# Patient Record
Sex: Male | Born: 1955
Health system: Southern US, Community
[De-identification: ages and names within clinical notes are randomized; demographics above are authoritative.]

## PROBLEM LIST (undated history)

## (undated) DIAGNOSIS — M1990 Unspecified osteoarthritis, unspecified site: Secondary | ICD-10-CM

## (undated) DIAGNOSIS — K602 Anal fissure, unspecified: Secondary | ICD-10-CM

## (undated) DIAGNOSIS — R42 Dizziness and giddiness: Secondary | ICD-10-CM

## (undated) DIAGNOSIS — I491 Atrial premature depolarization: Secondary | ICD-10-CM

## (undated) DIAGNOSIS — R002 Palpitations: Secondary | ICD-10-CM

## (undated) DIAGNOSIS — Z973 Presence of spectacles and contact lenses: Secondary | ICD-10-CM

## (undated) DIAGNOSIS — K219 Gastro-esophageal reflux disease without esophagitis: Secondary | ICD-10-CM

## (undated) DIAGNOSIS — I493 Ventricular premature depolarization: Secondary | ICD-10-CM

## (undated) DIAGNOSIS — R001 Bradycardia, unspecified: Secondary | ICD-10-CM

## (undated) DIAGNOSIS — R319 Hematuria, unspecified: Secondary | ICD-10-CM

## (undated) DIAGNOSIS — R9431 Abnormal electrocardiogram [ECG] [EKG]: Secondary | ICD-10-CM

## (undated) DIAGNOSIS — M109 Gout, unspecified: Secondary | ICD-10-CM

## (undated) DIAGNOSIS — F419 Anxiety disorder, unspecified: Secondary | ICD-10-CM

## (undated) DIAGNOSIS — Z87442 Personal history of urinary calculi: Secondary | ICD-10-CM

## (undated) HISTORY — DX: Anxiety disorder, unspecified: F41.9

## (undated) HISTORY — DX: Ventricular premature depolarization: I49.3

## (undated) HISTORY — DX: Anal fissure, unspecified: K60.2

## (undated) HISTORY — DX: Gout, unspecified: M10.9

## (undated) HISTORY — DX: Atrial premature depolarization: I49.1

## (undated) HISTORY — PX: SPHINCTEROTOMY: SHX5279

## (undated) HISTORY — DX: Bradycardia, unspecified: R00.1

## (undated) HISTORY — DX: Dizziness and giddiness: R42

## (undated) HISTORY — PX: COLONOSCOPY: SHX174

## (undated) HISTORY — PX: OTHER SURGICAL HISTORY: SHX169

## (undated) HISTORY — DX: Unspecified osteoarthritis, unspecified site: M19.90

## (undated) HISTORY — DX: Gastro-esophageal reflux disease without esophagitis: K21.9

---

## 2001-10-08 ENCOUNTER — Ambulatory Visit (HOSPITAL_COMMUNITY): Admission: RE | Admit: 2001-10-08 | Discharge: 2001-10-08 | Payer: Self-pay | Admitting: General Surgery

## 2003-06-12 ENCOUNTER — Encounter: Admission: RE | Admit: 2003-06-12 | Discharge: 2003-06-12 | Payer: Self-pay | Admitting: Internal Medicine

## 2005-02-21 ENCOUNTER — Ambulatory Visit: Payer: Self-pay | Admitting: Internal Medicine

## 2005-05-20 ENCOUNTER — Ambulatory Visit: Payer: Self-pay | Admitting: Internal Medicine

## 2008-09-01 ENCOUNTER — Encounter: Admission: RE | Admit: 2008-09-01 | Discharge: 2008-09-01 | Payer: Self-pay | Admitting: Otolaryngology

## 2008-12-12 ENCOUNTER — Encounter: Admission: RE | Admit: 2008-12-12 | Discharge: 2008-12-12 | Payer: Self-pay | Admitting: Otolaryngology

## 2010-01-31 ENCOUNTER — Encounter: Payer: Self-pay | Admitting: Internal Medicine

## 2010-02-13 ENCOUNTER — Emergency Department (HOSPITAL_COMMUNITY): Payer: BC Managed Care – PPO

## 2010-02-13 ENCOUNTER — Emergency Department (HOSPITAL_COMMUNITY)
Admission: EM | Admit: 2010-02-13 | Discharge: 2010-02-13 | Disposition: A | Discharge status: Home / Self Care | Payer: BC Managed Care – PPO | Attending: Emergency Medicine | Admitting: Emergency Medicine

## 2010-02-13 DIAGNOSIS — H81399 Other peripheral vertigo, unspecified ear: Secondary | ICD-10-CM | POA: Insufficient documentation

## 2010-02-13 LAB — POCT I-STAT, CHEM 8
Calcium, Ion: 1.14 mmol/L (ref 1.12–1.32)
HCT: 44 % (ref 39.0–52.0)
Potassium: 4.7 mEq/L (ref 3.5–5.1)
Sodium: 140 mEq/L (ref 135–145)

## 2010-05-28 NOTE — Op Note (Signed)
David Williamson, David Williamson                          ACCOUNT NO.:  [id number]   MEDICAL RECORD NO.:  [id number]                   PATIENT TYPE:  AMB   LOCATION:  DAY                                  FACILITY:  [facility]   PHYSICIAN:  Timothy E. Earlene Plater, M.D.              DATE OF BIRTH:  [birth year]   DATE OF PROCEDURE:  10/08/2001  DATE OF DISCHARGE:                                 OPERATIVE REPORT   PREOPERATIVE DIAGNOSIS:  Anal fissure.   POSTOPERATIVE DIAGNOSIS:  Anal fissure.   PROCEDURE:  Colonoscopy and repair of anal fissure.   SURGEON:  Timothy E. Earlene Plater, M.D.   COLONOSCOPIST:  Althea Grimmer. Luther Parody, M.D.   ANESTHESIA:  General.   INDICATIONS FOR PROCEDURE:  [name] has seen and followed in the office  for some months without resolution of anal fissure.  He is a very busy,  personality type A, driven, successful man, who understands his situation.  Because of his age, history of rectal bleeding, we have elected to seek GI  consultation, and colonoscopy will be done at the same occasion.  This is at  the patient's wish and actually works out very efficiently.  In the preop  work-up, his cardiogram is abnormal; however, three years ago in [place],  he had similar findings with a stress Cardiolite test that was negative.  We  will compare those results for him. He was identified and permit signed.   DESCRIPTION OF PROCEDURE:  He was taken to the operating room and placed  supine, LMA anesthesia provided.  He was then turned into the left lateral  position.  I carefully padded and positioned, and Dr. Luther Parody carried out  a colonoscopy and reported separately.  At the completion of his procedure,  the patient was left in the same position.  Perianal area prepped and draped  with Betadine.  Marcaine 0.25% with epinephrine was injected around and  about the anal orifice and massaged in well.  Anoscopy was negative.  A  prominent posterior anal fissure and sentinel tag were present.   The fissure  and tag were cauterized.  A left posterior percutaneous internal  sphincterotomy accomplished without difficulty or complication.  External  sphincter is intact.  He tolerated it well.  Gelfoam and a dry sterile  dressing applied, and he was removed to the recovery room in good condition.   Written and verbal instructions were given him and his wife, including  Percocet, and he will be seen and followed as an outpatient.                                               Timothy E. Earlene Plater, M.D.    TED/MEDQ  D:  10/08/2001  T:  10/08/2001  Job:  [id number]   cc:  Althea Grimmer. Luther Parody, M.D.  [address], [address]  [place]  [place] [address]  Fax: [phone]

## 2012-11-10 HISTORY — PX: OTHER SURGICAL HISTORY: SHX169

## 2013-12-30 ENCOUNTER — Other Ambulatory Visit (HOSPITAL_COMMUNITY): Payer: Self-pay | Admitting: Orthopaedic Surgery

## 2013-12-30 DIAGNOSIS — T84030A Mechanical loosening of internal right hip prosthetic joint, initial encounter: Secondary | ICD-10-CM

## 2014-01-13 ENCOUNTER — Encounter (HOSPITAL_COMMUNITY)
Admission: RE | Admit: 2014-01-13 | Discharge: 2014-01-13 | Disposition: A | Discharge status: Home / Self Care | Payer: BLUE CROSS/BLUE SHIELD | Source: Ambulatory Visit | Attending: Orthopaedic Surgery | Admitting: Orthopaedic Surgery

## 2014-01-13 DIAGNOSIS — T84030A Mechanical loosening of internal right hip prosthetic joint, initial encounter: Secondary | ICD-10-CM

## 2014-01-13 DIAGNOSIS — Z0389 Encounter for observation for other suspected diseases and conditions ruled out: Secondary | ICD-10-CM | POA: Diagnosis not present

## 2014-01-13 DIAGNOSIS — Z96641 Presence of right artificial hip joint: Secondary | ICD-10-CM | POA: Diagnosis not present

## 2014-01-13 MED ORDER — TECHNETIUM TC 99M MEDRONATE IV KIT
25.0000 | PACK | Freq: Once | INTRAVENOUS | Status: AC | PRN
  Administered 2014-01-13: 25 via INTRAVENOUS

## 2014-01-21 ENCOUNTER — Other Ambulatory Visit: Payer: Self-pay | Admitting: Orthopaedic Surgery

## 2014-01-21 DIAGNOSIS — M79671 Pain in right foot: Secondary | ICD-10-CM

## 2014-01-27 ENCOUNTER — Ambulatory Visit
Admission: RE | Admit: 2014-01-27 | Discharge: 2014-01-27 | Disposition: A | Discharge status: Home / Self Care | Payer: BLUE CROSS/BLUE SHIELD | Source: Ambulatory Visit | Attending: Orthopaedic Surgery | Admitting: Orthopaedic Surgery

## 2014-01-27 DIAGNOSIS — M79671 Pain in right foot: Secondary | ICD-10-CM

## 2014-02-07 DIAGNOSIS — Z96641 Presence of right artificial hip joint: Secondary | ICD-10-CM | POA: Insufficient documentation

## 2014-02-07 DIAGNOSIS — M25551 Pain in right hip: Secondary | ICD-10-CM | POA: Insufficient documentation

## 2014-09-29 ENCOUNTER — Ambulatory Visit (INDEPENDENT_AMBULATORY_CARE_PROVIDER_SITE_OTHER): Payer: BLUE CROSS/BLUE SHIELD | Admitting: Podiatry

## 2014-09-29 ENCOUNTER — Ambulatory Visit (INDEPENDENT_AMBULATORY_CARE_PROVIDER_SITE_OTHER): Payer: BLUE CROSS/BLUE SHIELD

## 2014-09-29 VITALS — BP 114/62 | HR 56 | Resp 16

## 2014-09-29 DIAGNOSIS — M1000 Idiopathic gout, unspecified site: Secondary | ICD-10-CM

## 2014-09-29 DIAGNOSIS — M779 Enthesopathy, unspecified: Secondary | ICD-10-CM | POA: Diagnosis not present

## 2014-09-29 DIAGNOSIS — M722 Plantar fascial fibromatosis: Secondary | ICD-10-CM | POA: Diagnosis not present

## 2014-09-29 NOTE — Progress Notes (Signed)
   Subjective:    Patient ID: David Williamson, male    DOB: 02-18-1955, 59 y.o.   MRN: [id number]  HPI Pt presents with bilateral foot pain in arches and dorsal forefoot, lasting 5 months, has tried various types of otc remedies with no relief, he is very active and feels as if pain is getting worse   Review of Systems  Musculoskeletal: Positive for arthralgias.  All other systems reviewed and are negative.      Objective:   Physical Exam        Assessment & Plan:

## 2014-10-01 ENCOUNTER — Telehealth (HOSPITAL_COMMUNITY): Payer: Self-pay | Admitting: *Deleted

## 2014-10-01 NOTE — Progress Notes (Signed)
Subjective:     Patient ID: David Williamson, male   DOB: 1955/11/28, 59 y.o.   MRN: [id number]  HPI patient presents stating he's had pain in both of his arches and diminishment of arch and heel pain of long-term duration. It has been sore but not severe and it always seems to bother him with activity   Review of Systems  All other systems reviewed and are negative.      Objective:   Physical Exam  Constitutional: He is oriented to person, place, and time.  Cardiovascular: Intact distal pulses.   Musculoskeletal: Normal range of motion.  Neurological: He is oriented to person, place, and time.  Skin: Skin is warm.  Nursing note and vitals reviewed.  neurovascular status intact muscle strength adequate range of motion within normal limits with patient found to have depression of the arch bilateral and also noted to have mild discomfort in the heel and arch of both feet with no specific areas of acute inflammation. Patient is well oriented 3 has good digital perfusion and mild equinus condition     Assessment:     Probable depression of the arch leading to fasciitis tendinitis-like condition secondary to foot structure    Plan:     H&P and x-rays reviewed. I have recommended a Berkley type orthotic to lift the arch and take stress off the heel and forefoot and patient is scanned for [facility] type orthotic devices. Reappoint when returned

## 2014-10-02 ENCOUNTER — Other Ambulatory Visit (HOSPITAL_COMMUNITY): Payer: Self-pay | Admitting: Internal Medicine

## 2014-10-02 DIAGNOSIS — R001 Bradycardia, unspecified: Secondary | ICD-10-CM

## 2014-10-03 DIAGNOSIS — R002 Palpitations: Secondary | ICD-10-CM | POA: Insufficient documentation

## 2014-10-03 DIAGNOSIS — R001 Bradycardia, unspecified: Secondary | ICD-10-CM | POA: Insufficient documentation

## 2014-10-06 ENCOUNTER — Other Ambulatory Visit: Payer: Self-pay

## 2014-10-06 ENCOUNTER — Ambulatory Visit (INDEPENDENT_AMBULATORY_CARE_PROVIDER_SITE_OTHER): Payer: BLUE CROSS/BLUE SHIELD

## 2014-10-06 ENCOUNTER — Ambulatory Visit (HOSPITAL_COMMUNITY): Payer: BLUE CROSS/BLUE SHIELD | Attending: Cardiology

## 2014-10-06 DIAGNOSIS — I351 Nonrheumatic aortic (valve) insufficiency: Secondary | ICD-10-CM | POA: Diagnosis not present

## 2014-10-06 DIAGNOSIS — R001 Bradycardia, unspecified: Secondary | ICD-10-CM | POA: Diagnosis not present

## 2014-10-06 DIAGNOSIS — R002 Palpitations: Secondary | ICD-10-CM | POA: Diagnosis not present

## 2014-10-06 DIAGNOSIS — I517 Cardiomegaly: Secondary | ICD-10-CM | POA: Insufficient documentation

## 2014-10-31 ENCOUNTER — Ambulatory Visit: Payer: BLUE CROSS/BLUE SHIELD | Admitting: *Deleted

## 2014-10-31 DIAGNOSIS — M722 Plantar fascial fibromatosis: Secondary | ICD-10-CM

## 2014-10-31 NOTE — Patient Instructions (Signed)

## 2014-10-31 NOTE — Progress Notes (Signed)
Patient ID: David Williamson, male   DOB: 1955/12/14, 59 y.o.   MRN: [id number] Patient presents for orthotic pick up.  Verbal and written break in and wear instructions given.  Patient will follow up in 4 weeks if symptoms worsen or fail to improve.

## 2014-11-05 ENCOUNTER — Ambulatory Visit: Payer: BLUE CROSS/BLUE SHIELD | Admitting: Internal Medicine

## 2014-11-26 ENCOUNTER — Encounter: Payer: Self-pay | Admitting: Internal Medicine

## 2014-12-01 ENCOUNTER — Encounter: Payer: Self-pay | Admitting: Internal Medicine

## 2014-12-01 ENCOUNTER — Ambulatory Visit (INDEPENDENT_AMBULATORY_CARE_PROVIDER_SITE_OTHER): Payer: BLUE CROSS/BLUE SHIELD | Admitting: Internal Medicine

## 2014-12-01 VITALS — BP 118/76 | HR 50 | Ht 74.0 in | Wt 191.0 lb

## 2014-12-01 DIAGNOSIS — R001 Bradycardia, unspecified: Secondary | ICD-10-CM

## 2014-12-01 DIAGNOSIS — I491 Atrial premature depolarization: Secondary | ICD-10-CM | POA: Diagnosis not present

## 2014-12-01 DIAGNOSIS — R002 Palpitations: Secondary | ICD-10-CM | POA: Diagnosis not present

## 2014-12-01 NOTE — Patient Instructions (Signed)
Medication Instructions:  Your physician recommends that you continue on your current medications as directed. Please refer to the Current Medication list given to you today.  Labwork: None ordered.  Testing/Procedures: None ordered.  Follow-Up: Your physician recommends that you schedule a follow-up appointment as needed.   Any Other Special Instructions Will Be Listed Below (If Applicable).     If you need a refill on your cardiac medications before your next appointment, please call your pharmacy.   

## 2014-12-01 NOTE — Progress Notes (Signed)
Electrophysiology Office Note   Date:  12/01/2014   ID:  David Williamson, DOB July 14, 1955, MRN [id number]  PCP:  Gwen Pounds, MD   Primary Electrophysiologist: Hillis Range, MD    Chief Complaint  Patient presents with  . Palpitations     History of Present Illness: [name] is a 59 y.o. male who presents today for electrophysiology evaluation.   The patient has a long history of bradycardia for which he has been asymptomatic.  He is physically very fit and exercises rigorously without limitation.  He has more recently noticed palpitations.  He describes "fluttering" and "skipped" heart beats, lasting only seconds.  These are infrequent but have caused him concern.  He underwent recent echo testing which was normal except for LVH.  He also had a holter monitor placed which documented sinus rhythm with frequent PACs and very rare PVCS.  He did have nonsustained atach also.  Today, he denies symptoms of chest pain, shortness of breath, orthopnea, PND, lower extremity edema, claudication, dizziness, presyncope, syncope, bleeding, or neurologic sequela. The patient is tolerating medications without difficulties and is otherwise without complaint today.    Past Medical History  Diagnosis Date  . Gout   . Anal fissure   . Vertigo     one episode  . Sinus bradycardia     asymptomatic  . Premature atrial contractions   . Premature ventricular contraction    Past Surgical History  Procedure Laterality Date  . Sphincterotomy    . Right hip replacement  11/14     Current Outpatient Prescriptions  Medication Sig Dispense Refill  . allopurinol (ZYLOPRIM) 300 MG tablet Take 300 mg by mouth daily.  0  . meloxicam (MOBIC) 7.5 MG tablet Take 7.5 mg by mouth daily as needed. (PAIN)     No current facility-administered medications for this visit.    Allergies:   Review of patient's allergies indicates no known allergies.   Social History:  The patient  reports that he has never  smoked. He has never used smokeless tobacco. He reports that he drinks about 8.4 oz of alcohol per week. He reports that he does not use illicit drugs.   Family History:  The patient's  family history includes Cancer in his father; Coronary artery disease in his mother; Diabetes Mellitus II in his mother; Hypertension in an other family member; Kidney cancer in his brother.   Denies FH of sudden death or arrhythmias   ROS:  Please see the history of present illness.   All other systems are reviewed and negative.    PHYSICAL EXAM: VS:  BP 118/76 mmHg  Pulse 50  Ht  (1.88 m)  Wt 191 lb (86.637 kg)  BMI 24.51 kg/m2 , BMI Body mass index is 24.51 kg/(m^2). GEN: Well nourished, well developed, in no acute distress HEENT: normal Neck: no JVD, carotid bruits, or masses Cardiac: RRR; no murmurs, rubs, or gallops,no edema  Respiratory:  clear to auscultation bilaterally, normal work of breathing GI: soft, nontender, nondistended, + BS MS: no deformity or atrophy Skin: warm and dry  Neuro:  Strength and sensation are intact Psych: euthymic mood, full affect  EKG:  EKG is ordered today. The ekg ordered today shows sinus rhythm 50 bpm with PACs, early repolarization   Recent Labs: No results found for requested labs within last 365 days.    Lipid Panel  No results found for: CHOL, TRIG, HDL, CHOLHDL, VLDL, LDLCALC, LDLDIRECT   Wt Readings from Last 3  Encounters:  12/01/14 191 lb (86.637 kg)      Other studies Reviewed: Additional studies/ records that were reviewed today include: Dr Ferd Hibbsusso's notes, prior echo, holter monitor   ASSESSMENT AND PLAN:  1.  Palpitations Per holter are due to pacs and pvcs He has no evidence of structural heart disease.  I have reassured the patient that his symptoms appear to be from a benign source. No further workup is planned Stress avoidance and ETOH avoidance are encouraged.  Caffeine in moderation should be ok..  2. Sinus  bradycardia Asymptomatic No further workup required No indication for pacing at this time  Follow-up with Dr Timothy Lassousso as scheduled I will see as needed going forward  Current medicines are reviewed at length with the patient today.   The patient does not have concerns regarding his medicines.  The following changes were made today:  none   Signed Hillis RangeJames Eshika Reckart, MD  12/01/2014 9:31 PM     [facility] [address] [place] [address] [phone] (office) [phone] (fax)

## 2014-12-08 ENCOUNTER — Encounter: Payer: Self-pay | Admitting: Podiatry

## 2014-12-08 ENCOUNTER — Ambulatory Visit (INDEPENDENT_AMBULATORY_CARE_PROVIDER_SITE_OTHER): Payer: BLUE CROSS/BLUE SHIELD | Admitting: Podiatry

## 2014-12-08 VITALS — BP 125/74 | HR 50 | Resp 16

## 2014-12-08 DIAGNOSIS — M1000 Idiopathic gout, unspecified site: Secondary | ICD-10-CM

## 2014-12-08 DIAGNOSIS — M722 Plantar fascial fibromatosis: Secondary | ICD-10-CM

## 2014-12-08 MED ORDER — TRIAMCINOLONE ACETONIDE 10 MG/ML IJ SUSP
10.0000 mg | Freq: Once | INTRAMUSCULAR | Status: AC
  Administered 2014-12-08: 10 mg

## 2014-12-08 NOTE — Progress Notes (Signed)
Subjective:     Patient ID: David Williamson, male   DOB: 04/02/1955, 59 y.o.   MRN: [id number]  HPI patient states I'm having trouble with my left arch with the right one feeling good. It hurts in the arch past where it inserts in the heel and it seems worse with activity   Review of Systems     Objective:   Physical Exam Neurovascular status intact muscle strength adequate with discomfort in the left plantar arch distal to the insertion to the calcaneus with inflammation noted. Patient also seems to have moderate depression of the arch    Assessment:     Plantar fascial symptomatology present left    Plan:     Advised on physical therapy and at this time injected the distal 2 calcaneal plantar fascia 3 mg Kenalog 5 mill grams Xylocaine and dispensed night splint

## 2014-12-30 DIAGNOSIS — M545 Low back pain, unspecified: Secondary | ICD-10-CM | POA: Insufficient documentation

## 2015-02-02 ENCOUNTER — Encounter: Payer: Self-pay | Admitting: Podiatry

## 2015-02-02 ENCOUNTER — Ambulatory Visit (INDEPENDENT_AMBULATORY_CARE_PROVIDER_SITE_OTHER): Payer: BLUE CROSS/BLUE SHIELD | Admitting: Podiatry

## 2015-02-02 VITALS — BP 125/69 | HR 47 | Resp 16

## 2015-02-02 DIAGNOSIS — M779 Enthesopathy, unspecified: Secondary | ICD-10-CM | POA: Diagnosis not present

## 2015-02-02 DIAGNOSIS — M722 Plantar fascial fibromatosis: Secondary | ICD-10-CM | POA: Diagnosis not present

## 2015-02-02 NOTE — Progress Notes (Signed)
Subjective:     Patient ID: David Williamson, male   DOB: 01-04-1956, 60 y.o.   MRN: [id number]  HPI patient presents stating I still get discomfort in my left arch if I'm real active   Review of Systems     Objective:   Physical Exam Neurovascular status intact muscle strength adequate with discomfort in the mid arch area left that his low grade but present    Assessment:     Continued low grade fasciitis left    Plan:     We discussed modifications and exercise and also continued physical therapy and boot usage. Patient's left orthotic a be redone and we will see how he does with that

## 2015-02-27 ENCOUNTER — Ambulatory Visit: Payer: BLUE CROSS/BLUE SHIELD | Admitting: *Deleted

## 2015-02-27 DIAGNOSIS — M722 Plantar fascial fibromatosis: Secondary | ICD-10-CM

## 2015-02-27 NOTE — Progress Notes (Signed)
Patient ID: David Williamson, male   DOB: 10/04/1955, 59 y.o.   MRN: [id number] Patient presents for orthotic pick up.  Verbal and written break in and wear instructions given.  Patient will follow up in 4 weeks if symptoms worsen or fail to improve. 

## 2015-02-27 NOTE — Patient Instructions (Signed)

## 2015-04-13 ENCOUNTER — Ambulatory Visit: Payer: BLUE CROSS/BLUE SHIELD | Admitting: *Deleted

## 2015-04-13 DIAGNOSIS — M722 Plantar fascial fibromatosis: Secondary | ICD-10-CM

## 2015-04-13 NOTE — Progress Notes (Signed)
Patient ID: David Williamson, male   DOB: 1955/09/26, 60 y.o.   MRN: [id number] Patient presents stating that orthotics are still causing pain especially the left so much so that he can no longer wear it.  After visual inspection it appears arch is not contouring well and is low will send back of adjustment and call when they return.

## 2015-05-14 ENCOUNTER — Other Ambulatory Visit: Payer: Self-pay | Admitting: Gastroenterology

## 2015-05-14 DIAGNOSIS — R109 Unspecified abdominal pain: Secondary | ICD-10-CM | POA: Diagnosis not present

## 2015-05-14 DIAGNOSIS — Z8051 Family history of malignant neoplasm of kidney: Secondary | ICD-10-CM | POA: Diagnosis not present

## 2015-05-14 DIAGNOSIS — R1013 Epigastric pain: Secondary | ICD-10-CM | POA: Diagnosis not present

## 2015-05-25 ENCOUNTER — Other Ambulatory Visit: Payer: Self-pay

## 2015-05-26 ENCOUNTER — Encounter: Payer: Self-pay | Admitting: Internal Medicine

## 2015-06-02 ENCOUNTER — Ambulatory Visit
Admission: RE | Admit: 2015-06-02 | Discharge: 2015-06-02 | Disposition: A | Discharge status: Home / Self Care | Payer: BLUE CROSS/BLUE SHIELD | Source: Ambulatory Visit | Attending: Gastroenterology | Admitting: Gastroenterology

## 2015-06-02 DIAGNOSIS — R109 Unspecified abdominal pain: Secondary | ICD-10-CM

## 2015-06-09 ENCOUNTER — Encounter: Payer: Self-pay | Admitting: Internal Medicine

## 2015-06-10 ENCOUNTER — Encounter: Payer: Self-pay | Admitting: Internal Medicine

## 2015-06-22 ENCOUNTER — Ambulatory Visit (AMBULATORY_SURGERY_CENTER): Payer: Self-pay | Admitting: *Deleted

## 2015-06-22 VITALS — Ht 73.0 in | Wt 190.0 lb

## 2015-06-22 DIAGNOSIS — M109 Gout, unspecified: Secondary | ICD-10-CM | POA: Diagnosis not present

## 2015-06-22 DIAGNOSIS — Z125 Encounter for screening for malignant neoplasm of prostate: Secondary | ICD-10-CM | POA: Diagnosis not present

## 2015-06-22 DIAGNOSIS — Z0000 Encounter for general adult medical examination without abnormal findings: Secondary | ICD-10-CM | POA: Diagnosis not present

## 2015-06-22 DIAGNOSIS — Z1211 Encounter for screening for malignant neoplasm of colon: Secondary | ICD-10-CM

## 2015-06-22 MED ORDER — NA SULFATE-K SULFATE-MG SULF 17.5-3.13-1.6 GM/177ML PO SOLN: 1.0000 | Freq: Once | ORAL | Status: DC

## 2015-06-22 NOTE — Progress Notes (Signed)
No egg or soy allergy known to patient  No issues with past sedation with any surgeries  or procedures, no intubation problems  No diet pills per patient No home 02 use per patient  No blood thinners per patient  Pt denies issues with constipation  emmi declined   

## 2015-06-24 ENCOUNTER — Encounter: Payer: Self-pay | Admitting: Internal Medicine

## 2015-06-29 DIAGNOSIS — M859 Disorder of bone density and structure, unspecified: Secondary | ICD-10-CM | POA: Diagnosis not present

## 2015-06-29 DIAGNOSIS — R001 Bradycardia, unspecified: Secondary | ICD-10-CM | POA: Diagnosis not present

## 2015-06-29 DIAGNOSIS — R002 Palpitations: Secondary | ICD-10-CM | POA: Diagnosis not present

## 2015-06-29 DIAGNOSIS — Z6825 Body mass index (BMI) 25.0-25.9, adult: Secondary | ICD-10-CM | POA: Diagnosis not present

## 2015-06-29 DIAGNOSIS — R109 Unspecified abdominal pain: Secondary | ICD-10-CM | POA: Diagnosis not present

## 2015-06-29 DIAGNOSIS — Z0000 Encounter for general adult medical examination without abnormal findings: Secondary | ICD-10-CM | POA: Diagnosis not present

## 2015-06-29 DIAGNOSIS — Z1389 Encounter for screening for other disorder: Secondary | ICD-10-CM | POA: Diagnosis not present

## 2015-06-29 DIAGNOSIS — M1990 Unspecified osteoarthritis, unspecified site: Secondary | ICD-10-CM | POA: Diagnosis not present

## 2015-07-03 DIAGNOSIS — Z1212 Encounter for screening for malignant neoplasm of rectum: Secondary | ICD-10-CM | POA: Diagnosis not present

## 2015-07-07 ENCOUNTER — Encounter: Payer: Self-pay | Admitting: Internal Medicine

## 2015-07-07 ENCOUNTER — Ambulatory Visit (AMBULATORY_SURGERY_CENTER): Payer: BLUE CROSS/BLUE SHIELD | Admitting: Internal Medicine

## 2015-07-07 VITALS — BP 111/73 | HR 47 | Temp 97.8°F | Resp 8 | Ht 73.0 in | Wt 190.0 lb

## 2015-07-07 DIAGNOSIS — Z1211 Encounter for screening for malignant neoplasm of colon: Secondary | ICD-10-CM

## 2015-07-07 MED ORDER — SODIUM CHLORIDE 0.9 % IV SOLN: 500.0000 mL | INTRAVENOUS | Status: DC

## 2015-07-07 NOTE — Progress Notes (Signed)
To recovery, report to McCoy, RN, VSS 

## 2015-07-07 NOTE — Patient Instructions (Signed)
Discharge instructions given. Handouts on diverticulosis and a high fiber diet Resume previous medications. YOU HAD AN ENDOSCOPIC PROCEDURE TODAY AT THE [facility]:   Refer to the procedure report that was given to you for any specific questions about what was found during the examination.  If the procedure report does not answer your questions, please call your gastroenterologist to clarify.  If you requested that your care partner not be given the details of your procedure findings, then the procedure report has been included in a sealed envelope for you to review at your convenience later.  YOU SHOULD EXPECT: Some feelings of bloating in the abdomen. Passage of more gas than usual.  Walking can help get rid of the air that was put into your GI tract during the procedure and reduce the bloating. If you had a lower endoscopy (such as a colonoscopy or flexible sigmoidoscopy) you may notice spotting of blood in your stool or on the toilet paper. If you underwent a bowel prep for your procedure, you may not have a normal bowel movement for a few days.  Please Note:  You might notice some irritation and congestion in your nose or some drainage.  This is from the oxygen used during your procedure.  There is no need for concern and it should clear up in a day or so.  SYMPTOMS TO REPORT IMMEDIATELY:   Following lower endoscopy (colonoscopy or flexible sigmoidoscopy):  Excessive amounts of blood in the stool  Significant tenderness or worsening of abdominal pains  Swelling of the abdomen that is new, acute  Fever of 100F or higher   For urgent or emergent issues, a gastroenterologist can be reached at any hour by calling [phone].   DIET: Your first meal following the procedure should be a small meal and then it is ok to progress to your normal diet. Heavy or fried foods are harder to digest and may make you feel nauseous or bloated.  Likewise, meals heavy in dairy and vegetables  can increase bloating.  Drink plenty of fluids but you should avoid alcoholic beverages for 24 hours.  ACTIVITY:  You should plan to take it easy for the rest of today and you should NOT DRIVE or use heavy machinery until tomorrow (because of the sedation medicines used during the test).    FOLLOW UP: Our staff will call the number listed on your records the next business day following your procedure to check on you and address any questions or concerns that you may have regarding the information given to you following your procedure. If we do not reach you, we will leave a message.  However, if you are feeling well and you are not experiencing any problems, there is no need to return our call.  We will assume that you have returned to your regular daily activities without incident.  If any biopsies were taken you will be contacted by phone or by letter within the next 1-3 weeks.  Please call us at [phone] if you have not heard about the biopsies in 3 weeks.    SIGNATURES/CONFIDENTIALITY: You and/or your care partner have signed paperwork which will be entered into your electronic medical record.  These signatures attest to the fact that that the information above on your After Visit Summary has been reviewed and is understood.  Full responsibility of the confidentiality of this discharge information lies with you and/or your care-partner. 

## 2015-07-07 NOTE — Op Note (Signed)
[facility] Patient Name: Roxy Cedarnthony Telfair Procedure Date: 07/07/2015 1:07 PM MRN: [id number] Endoscopist: Wilhemina BonitoJohn N. Marina GoodellPerry , MD Age: [age] Referring MD:  Date of Birth: 1955-10-02 Gender: Male Account #: [id number] Procedure:                Colonoscopy Indications:              Screening for colorectal malignant neoplasm. Prior                            examinations 2003 and 2007 negative for neoplasia. Medicines:                Monitored Anesthesia Care Procedure:                Pre-Anesthesia Assessment:                           - Prior to the procedure, a History and Physical                            was performed, and patient medications and                            allergies were reviewed. The patient's tolerance of                            previous anesthesia was also reviewed. The risks                            and benefits of the procedure and the sedation                            options and risks were discussed with the patient.                            All questions were answered, and informed consent                            was obtained. Prior Anticoagulants: The patient has                            taken no previous anticoagulant or antiplatelet                            agents. ASA Grade Assessment: I - A normal, healthy                            patient. After reviewing the risks and benefits,                            the patient was deemed in satisfactory condition to                            undergo the procedure.  After obtaining informed consent, the colonoscope                            was passed under direct vision. Throughout the                            procedure, the patient's blood pressure, pulse, and                            oxygen saturations were monitored continuously. The                            Model CF-HQ190L [id number](SN#[id number]) scope was introduced                            through the anus and  advanced to the the cecum,                            identified by appendiceal orifice and ileocecal                            valve. The ileocecal valve, appendiceal orifice,                            and rectum were photographed. The quality of the                            bowel preparation was excellent. The colonoscopy                            was performed without difficulty. The patient                            tolerated the procedure well. The bowel preparation                            used was SUPREP. Scope In: 1:23:04 PM Scope Out: 1:38:27 PM Scope Withdrawal Time: 0 hours 10 minutes 39 seconds  Total Procedure Duration: 0 hours 15 minutes 23 seconds  Findings:                 Multiple diverticula were found in the left colon                            and right colon.                           The exam was otherwise without abnormality on                            direct and retroflexion views. Complications:            No immediate complications. Estimated blood loss:  None. Estimated Blood Loss:     Estimated blood loss: none. Impression:               - Diverticulosis in the left colon and in the right                            colon.                           - The examination was otherwise normal on direct                            and retroflexion views.                           - No specimens collected. Recommendation:           - Repeat colonoscopy in 10 years for screening                            purposes.                           - Patient has a contact number available for                            emergencies. The signs and symptoms of potential                            delayed complications were discussed with the                            patient. Return to normal activities tomorrow.                            Written discharge instructions were provided to the                            patient.                            - Resume previous diet.                           - Continue present medications. Wilhemina Bonito. Marina Goodell, MD 07/07/2015 1:44:48 PM This report has been signed electronically. CC Letter to:             Creola Corn, MD

## 2015-07-08 ENCOUNTER — Telehealth: Payer: Self-pay | Admitting: *Deleted

## 2015-07-08 NOTE — Telephone Encounter (Signed)
  Follow up Call-  Call back number 07/07/2015  Post procedure Call Back phone  # [phone]  Permission to leave phone message Yes     Patient questions:  Do you have a fever, pain , or abdominal swelling? No. Pain Score  0 *  Have you tolerated food without any problems? Yes.    Have you been able to return to your normal activities? Yes.    Do you have any questions about your discharge instructions: Diet   No. Medications  No. Follow up visit  No.  Do you have questions or concerns about your Care? No.  Actions: * If pain score is 4 or above: No action needed, pain <4.

## 2015-08-10 ENCOUNTER — Encounter: Payer: BLUE CROSS/BLUE SHIELD | Admitting: Internal Medicine

## 2015-08-25 ENCOUNTER — Encounter: Payer: BLUE CROSS/BLUE SHIELD | Admitting: Internal Medicine

## 2015-10-14 DIAGNOSIS — Z23 Encounter for immunization: Secondary | ICD-10-CM | POA: Diagnosis not present

## 2015-11-02 DIAGNOSIS — D2261 Melanocytic nevi of right upper limb, including shoulder: Secondary | ICD-10-CM | POA: Diagnosis not present

## 2015-11-02 DIAGNOSIS — D1801 Hemangioma of skin and subcutaneous tissue: Secondary | ICD-10-CM | POA: Diagnosis not present

## 2015-11-02 DIAGNOSIS — D225 Melanocytic nevi of trunk: Secondary | ICD-10-CM | POA: Diagnosis not present

## 2015-11-02 DIAGNOSIS — D485 Neoplasm of uncertain behavior of skin: Secondary | ICD-10-CM | POA: Diagnosis not present

## 2015-11-02 DIAGNOSIS — L821 Other seborrheic keratosis: Secondary | ICD-10-CM | POA: Diagnosis not present

## 2016-05-23 DIAGNOSIS — M5126 Other intervertebral disc displacement, lumbar region: Secondary | ICD-10-CM | POA: Diagnosis not present

## 2016-05-23 DIAGNOSIS — M5136 Other intervertebral disc degeneration, lumbar region: Secondary | ICD-10-CM | POA: Diagnosis not present

## 2016-05-23 DIAGNOSIS — M545 Low back pain: Secondary | ICD-10-CM | POA: Diagnosis not present

## 2016-05-23 DIAGNOSIS — M791 Myalgia: Secondary | ICD-10-CM | POA: Diagnosis not present

## 2016-05-23 DIAGNOSIS — R200 Anesthesia of skin: Secondary | ICD-10-CM | POA: Diagnosis not present

## 2016-05-27 DIAGNOSIS — M5136 Other intervertebral disc degeneration, lumbar region: Secondary | ICD-10-CM | POA: Diagnosis not present

## 2016-05-27 DIAGNOSIS — R200 Anesthesia of skin: Secondary | ICD-10-CM | POA: Diagnosis not present

## 2016-05-27 DIAGNOSIS — M791 Myalgia: Secondary | ICD-10-CM | POA: Diagnosis not present

## 2016-05-27 DIAGNOSIS — M545 Low back pain: Secondary | ICD-10-CM | POA: Diagnosis not present

## 2016-05-27 DIAGNOSIS — M5126 Other intervertebral disc displacement, lumbar region: Secondary | ICD-10-CM | POA: Diagnosis not present

## 2016-05-30 DIAGNOSIS — R200 Anesthesia of skin: Secondary | ICD-10-CM | POA: Diagnosis not present

## 2016-05-30 DIAGNOSIS — M5126 Other intervertebral disc displacement, lumbar region: Secondary | ICD-10-CM | POA: Diagnosis not present

## 2016-05-30 DIAGNOSIS — M5136 Other intervertebral disc degeneration, lumbar region: Secondary | ICD-10-CM | POA: Diagnosis not present

## 2016-05-30 DIAGNOSIS — M545 Low back pain: Secondary | ICD-10-CM | POA: Diagnosis not present

## 2016-05-30 DIAGNOSIS — M791 Myalgia: Secondary | ICD-10-CM | POA: Diagnosis not present

## 2016-06-03 DIAGNOSIS — M5136 Other intervertebral disc degeneration, lumbar region: Secondary | ICD-10-CM | POA: Diagnosis not present

## 2016-06-03 DIAGNOSIS — M545 Low back pain: Secondary | ICD-10-CM | POA: Diagnosis not present

## 2016-06-03 DIAGNOSIS — M5126 Other intervertebral disc displacement, lumbar region: Secondary | ICD-10-CM | POA: Diagnosis not present

## 2016-06-03 DIAGNOSIS — M791 Myalgia: Secondary | ICD-10-CM | POA: Diagnosis not present

## 2016-06-03 DIAGNOSIS — R200 Anesthesia of skin: Secondary | ICD-10-CM | POA: Diagnosis not present

## 2016-06-10 DIAGNOSIS — M5126 Other intervertebral disc displacement, lumbar region: Secondary | ICD-10-CM | POA: Diagnosis not present

## 2016-06-10 DIAGNOSIS — M791 Myalgia: Secondary | ICD-10-CM | POA: Diagnosis not present

## 2016-06-10 DIAGNOSIS — M545 Low back pain: Secondary | ICD-10-CM | POA: Diagnosis not present

## 2016-06-10 DIAGNOSIS — R200 Anesthesia of skin: Secondary | ICD-10-CM | POA: Diagnosis not present

## 2016-06-10 DIAGNOSIS — M5136 Other intervertebral disc degeneration, lumbar region: Secondary | ICD-10-CM | POA: Diagnosis not present

## 2016-06-13 DIAGNOSIS — M5126 Other intervertebral disc displacement, lumbar region: Secondary | ICD-10-CM | POA: Diagnosis not present

## 2016-06-13 DIAGNOSIS — M791 Myalgia: Secondary | ICD-10-CM | POA: Diagnosis not present

## 2016-06-13 DIAGNOSIS — M545 Low back pain: Secondary | ICD-10-CM | POA: Diagnosis not present

## 2016-06-13 DIAGNOSIS — R200 Anesthesia of skin: Secondary | ICD-10-CM | POA: Diagnosis not present

## 2016-06-13 DIAGNOSIS — M5136 Other intervertebral disc degeneration, lumbar region: Secondary | ICD-10-CM | POA: Diagnosis not present

## 2016-06-27 DIAGNOSIS — Z125 Encounter for screening for malignant neoplasm of prostate: Secondary | ICD-10-CM | POA: Diagnosis not present

## 2016-06-27 DIAGNOSIS — M859 Disorder of bone density and structure, unspecified: Secondary | ICD-10-CM | POA: Diagnosis not present

## 2016-06-27 DIAGNOSIS — Z0000 Encounter for general adult medical examination without abnormal findings: Secondary | ICD-10-CM | POA: Diagnosis not present

## 2016-06-27 DIAGNOSIS — M109 Gout, unspecified: Secondary | ICD-10-CM | POA: Diagnosis not present

## 2016-06-28 DIAGNOSIS — M5136 Other intervertebral disc degeneration, lumbar region: Secondary | ICD-10-CM | POA: Diagnosis not present

## 2016-06-28 DIAGNOSIS — M5126 Other intervertebral disc displacement, lumbar region: Secondary | ICD-10-CM | POA: Diagnosis not present

## 2016-06-28 DIAGNOSIS — M545 Low back pain: Secondary | ICD-10-CM | POA: Diagnosis not present

## 2016-06-28 DIAGNOSIS — R200 Anesthesia of skin: Secondary | ICD-10-CM | POA: Diagnosis not present

## 2016-06-28 DIAGNOSIS — M791 Myalgia: Secondary | ICD-10-CM | POA: Diagnosis not present

## 2016-07-01 DIAGNOSIS — M791 Myalgia: Secondary | ICD-10-CM | POA: Diagnosis not present

## 2016-07-01 DIAGNOSIS — M545 Low back pain: Secondary | ICD-10-CM | POA: Diagnosis not present

## 2016-07-01 DIAGNOSIS — R200 Anesthesia of skin: Secondary | ICD-10-CM | POA: Diagnosis not present

## 2016-07-01 DIAGNOSIS — M5136 Other intervertebral disc degeneration, lumbar region: Secondary | ICD-10-CM | POA: Diagnosis not present

## 2016-07-01 DIAGNOSIS — M5126 Other intervertebral disc displacement, lumbar region: Secondary | ICD-10-CM | POA: Diagnosis not present

## 2016-07-08 DIAGNOSIS — M5126 Other intervertebral disc displacement, lumbar region: Secondary | ICD-10-CM | POA: Diagnosis not present

## 2016-07-08 DIAGNOSIS — M5136 Other intervertebral disc degeneration, lumbar region: Secondary | ICD-10-CM | POA: Diagnosis not present

## 2016-07-08 DIAGNOSIS — R001 Bradycardia, unspecified: Secondary | ICD-10-CM | POA: Diagnosis not present

## 2016-07-08 DIAGNOSIS — M1990 Unspecified osteoarthritis, unspecified site: Secondary | ICD-10-CM | POA: Diagnosis not present

## 2016-07-08 DIAGNOSIS — M791 Myalgia: Secondary | ICD-10-CM | POA: Diagnosis not present

## 2016-07-08 DIAGNOSIS — Z0000 Encounter for general adult medical examination without abnormal findings: Secondary | ICD-10-CM | POA: Diagnosis not present

## 2016-07-08 DIAGNOSIS — M859 Disorder of bone density and structure, unspecified: Secondary | ICD-10-CM | POA: Diagnosis not present

## 2016-07-08 DIAGNOSIS — M545 Low back pain: Secondary | ICD-10-CM | POA: Diagnosis not present

## 2016-07-08 DIAGNOSIS — R200 Anesthesia of skin: Secondary | ICD-10-CM | POA: Diagnosis not present

## 2016-07-08 DIAGNOSIS — M109 Gout, unspecified: Secondary | ICD-10-CM | POA: Diagnosis not present

## 2016-07-11 DIAGNOSIS — M791 Myalgia: Secondary | ICD-10-CM | POA: Diagnosis not present

## 2016-07-11 DIAGNOSIS — R200 Anesthesia of skin: Secondary | ICD-10-CM | POA: Diagnosis not present

## 2016-07-11 DIAGNOSIS — M5136 Other intervertebral disc degeneration, lumbar region: Secondary | ICD-10-CM | POA: Diagnosis not present

## 2016-07-11 DIAGNOSIS — Z1212 Encounter for screening for malignant neoplasm of rectum: Secondary | ICD-10-CM | POA: Diagnosis not present

## 2016-07-11 DIAGNOSIS — M5126 Other intervertebral disc displacement, lumbar region: Secondary | ICD-10-CM | POA: Diagnosis not present

## 2016-07-11 LAB — IFOBT (OCCULT BLOOD): IFOBT: NEGATIVE

## 2016-07-15 DIAGNOSIS — M5126 Other intervertebral disc displacement, lumbar region: Secondary | ICD-10-CM | POA: Diagnosis not present

## 2016-07-15 DIAGNOSIS — M5136 Other intervertebral disc degeneration, lumbar region: Secondary | ICD-10-CM | POA: Diagnosis not present

## 2016-07-15 DIAGNOSIS — M791 Myalgia: Secondary | ICD-10-CM | POA: Diagnosis not present

## 2016-07-15 DIAGNOSIS — R200 Anesthesia of skin: Secondary | ICD-10-CM | POA: Diagnosis not present

## 2016-07-18 DIAGNOSIS — M791 Myalgia: Secondary | ICD-10-CM | POA: Diagnosis not present

## 2016-07-18 DIAGNOSIS — M5126 Other intervertebral disc displacement, lumbar region: Secondary | ICD-10-CM | POA: Diagnosis not present

## 2016-07-18 DIAGNOSIS — R200 Anesthesia of skin: Secondary | ICD-10-CM | POA: Diagnosis not present

## 2016-07-18 DIAGNOSIS — M5136 Other intervertebral disc degeneration, lumbar region: Secondary | ICD-10-CM | POA: Diagnosis not present

## 2016-09-05 IMAGING — US US ABDOMEN COMPLETE
1 series · 14 of 25 positions shown · non-contrast
Comparison: 02/13/2006.

CLINICAL DATA: Abdominal pain.

EXAM:
ABDOMEN ULTRASOUND COMPLETE

[Series 1: us abdomen complete · 0.20mm/px · 14 of 82 slices shown]
[im 1/82]
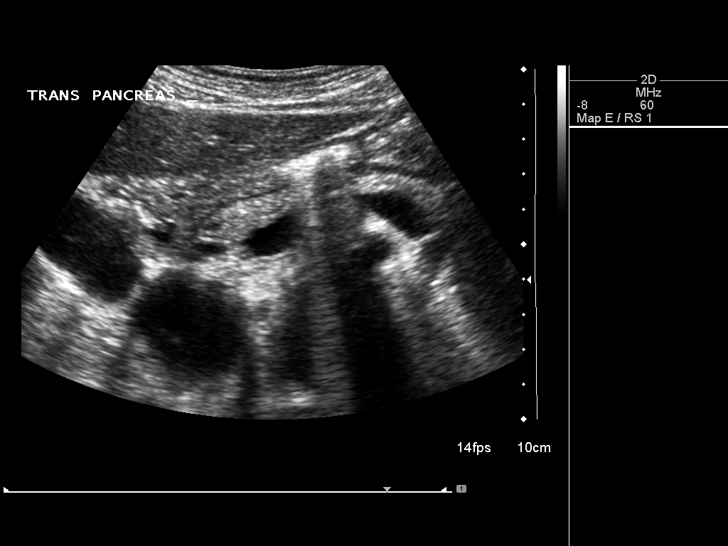
[im 7/82]
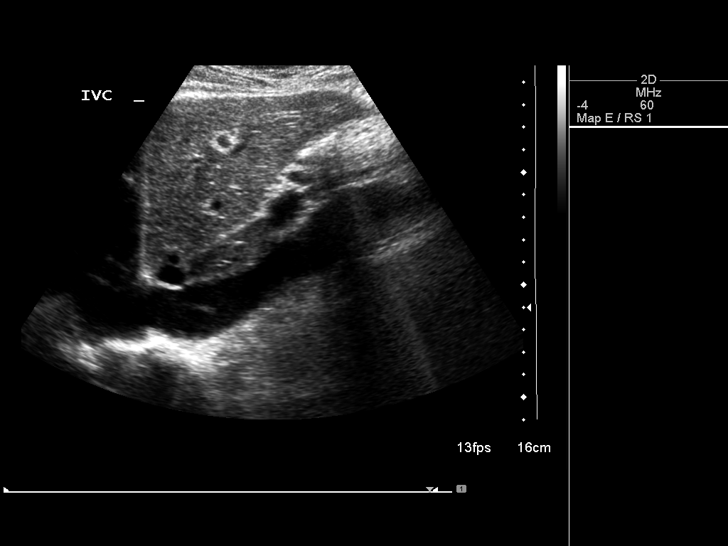
[im 14/82]
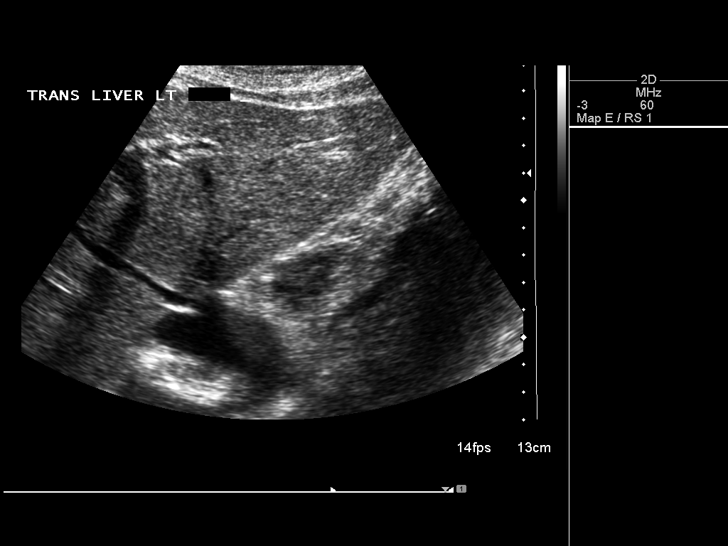
[im 21/82]
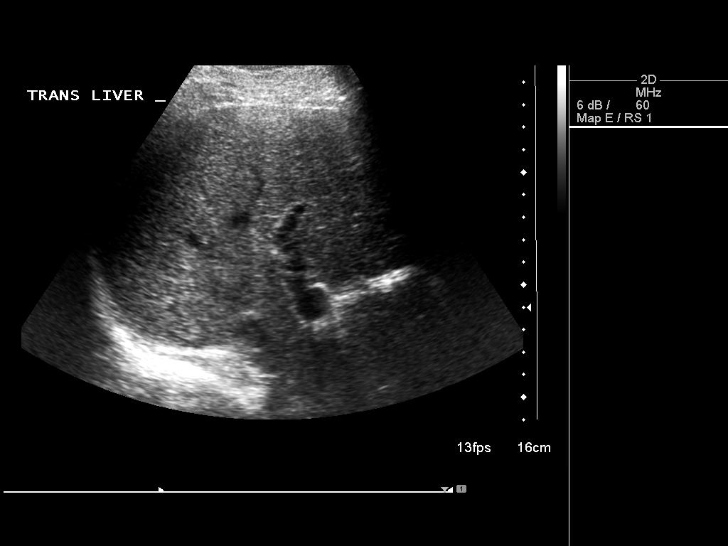
[im 28/82]
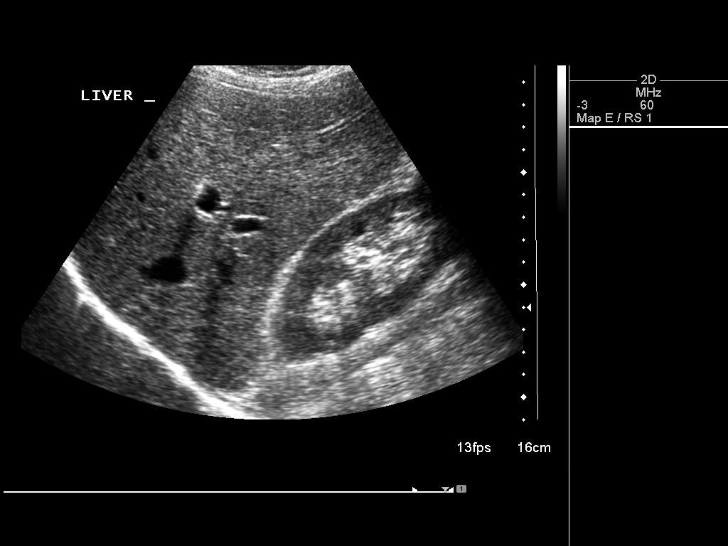
[im 31/82]
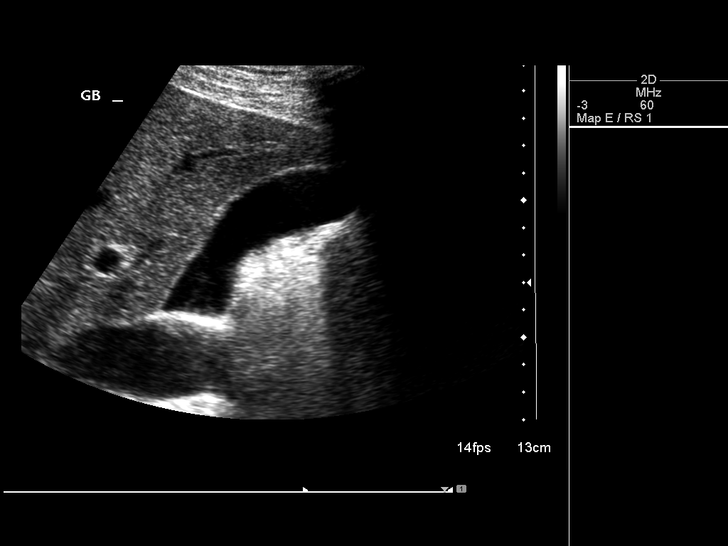
[im 38/82]
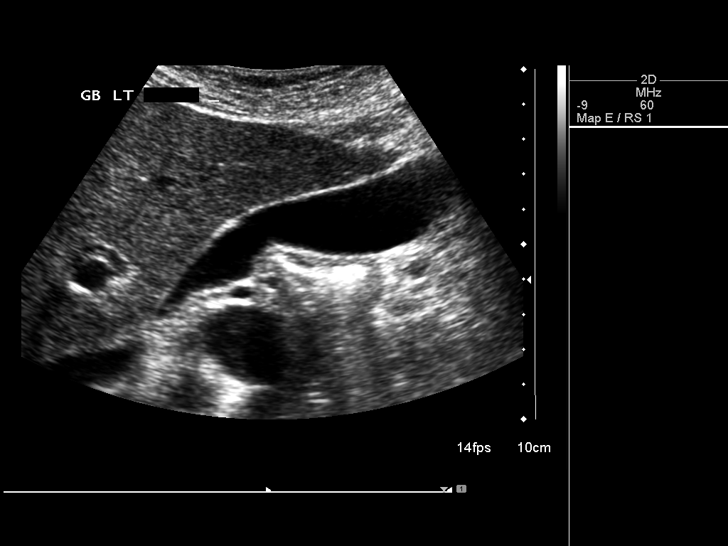
[im 44/82]
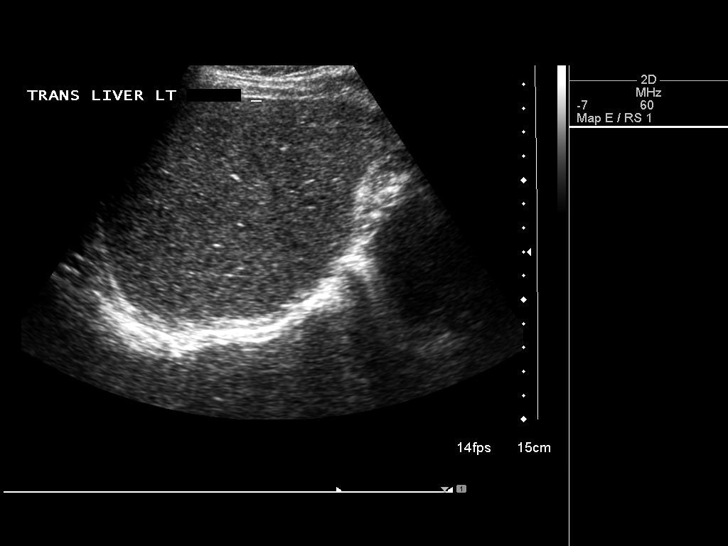
[im 51/82]
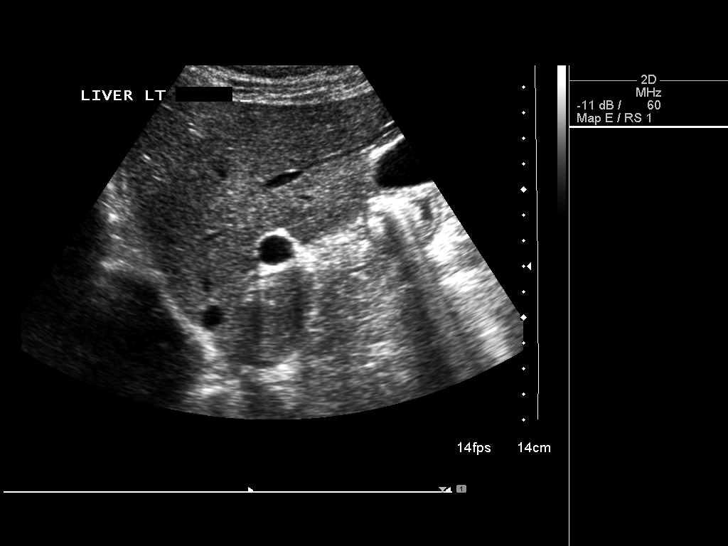
[im 55/82]
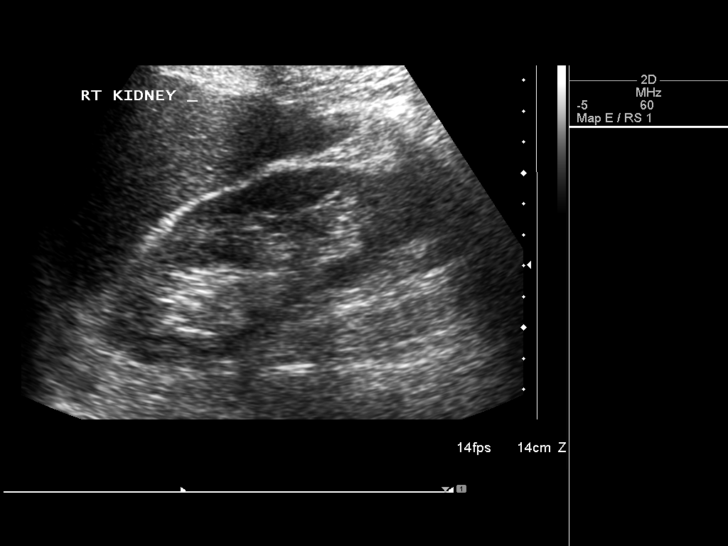
[im 61/82]
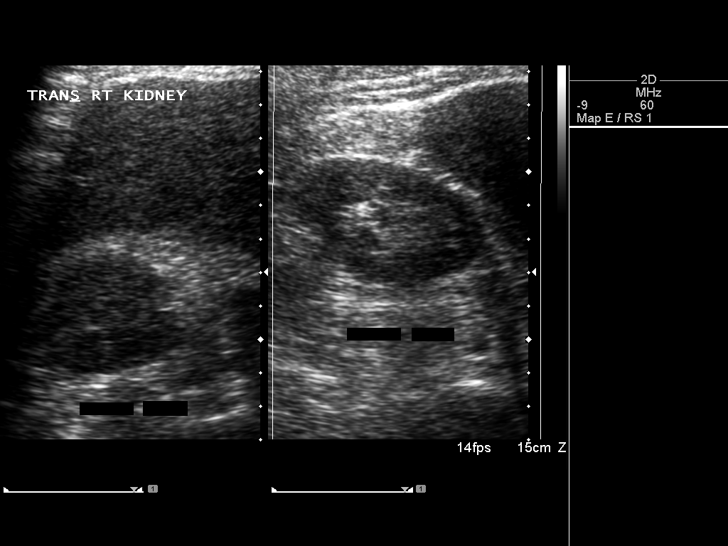
[im 68/82]
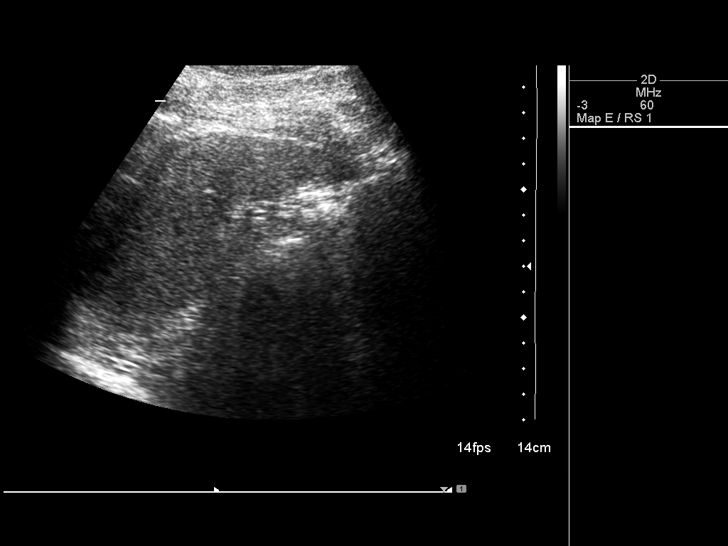
[im 75/82]
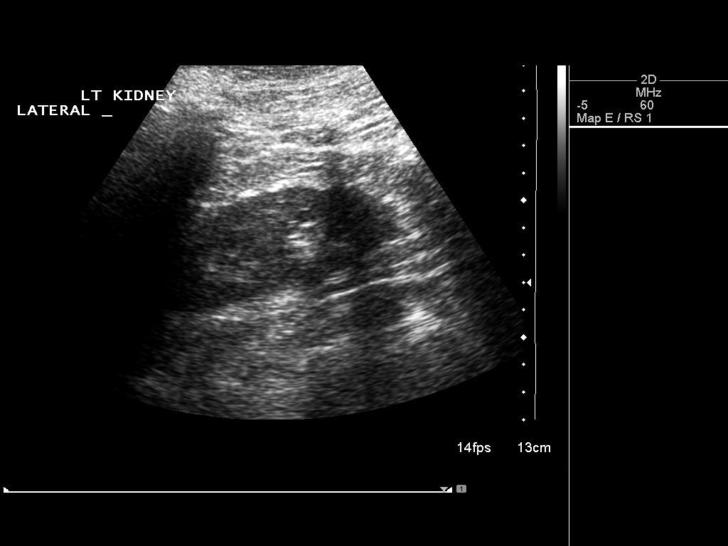
[im 82/82]
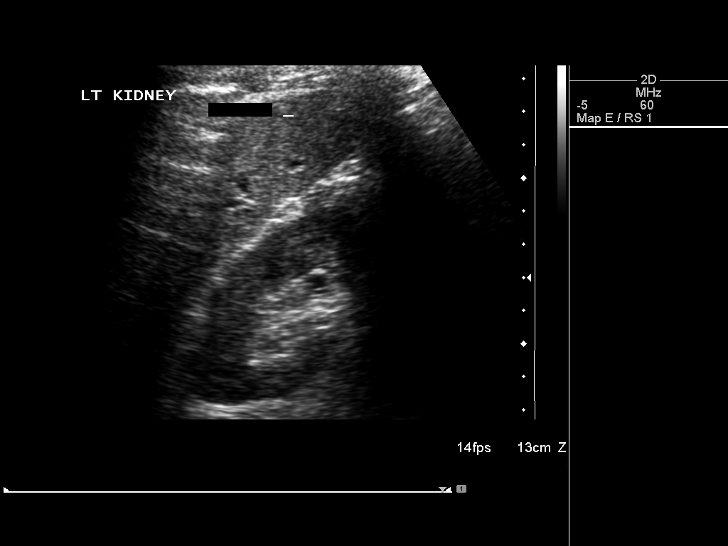

[14 of 25 positions shown; findings below may reference images not displayed]

FINDINGS: Gallbladder: No gallstones or wall thickening visualized. No
sonographic Murphy sign noted by sonographer.

Common bile duct: Diameter: 4.3 mm

Liver: No focal lesion identified. Within normal limits in
parenchymal echogenicity.

IVC: No abnormality visualized.

Pancreas: Visualized portion unremarkable.

Spleen: Size and appearance within normal limits.

Right Kidney: Length: 11.1 cm. Echogenicity within normal limits. No
mass or hydronephrosis visualized.

Left Kidney: Length: 11.2 cm. Echogenicity within normal limits. No
mass or hydronephrosis visualized.

Abdominal aorta: No aneurysm visualized.

Other findings: None.
IMPRESSION: Negative exam.

## 2016-11-18 DIAGNOSIS — D485 Neoplasm of uncertain behavior of skin: Secondary | ICD-10-CM | POA: Diagnosis not present

## 2016-11-18 DIAGNOSIS — L821 Other seborrheic keratosis: Secondary | ICD-10-CM | POA: Diagnosis not present

## 2016-11-18 DIAGNOSIS — D1801 Hemangioma of skin and subcutaneous tissue: Secondary | ICD-10-CM | POA: Diagnosis not present

## 2016-11-18 DIAGNOSIS — L430 Hypertrophic lichen planus: Secondary | ICD-10-CM | POA: Diagnosis not present

## 2016-11-18 DIAGNOSIS — D2371 Other benign neoplasm of skin of right lower limb, including hip: Secondary | ICD-10-CM | POA: Diagnosis not present

## 2016-11-18 DIAGNOSIS — D225 Melanocytic nevi of trunk: Secondary | ICD-10-CM | POA: Diagnosis not present

## 2017-02-22 ENCOUNTER — Ambulatory Visit (INDEPENDENT_AMBULATORY_CARE_PROVIDER_SITE_OTHER): Payer: BLUE CROSS/BLUE SHIELD | Admitting: Internal Medicine

## 2017-02-22 ENCOUNTER — Encounter: Payer: Self-pay | Admitting: Internal Medicine

## 2017-02-22 VITALS — BP 122/60 | HR 51 | Ht 73.0 in | Wt 194.0 lb

## 2017-02-22 DIAGNOSIS — R9431 Abnormal electrocardiogram [ECG] [EKG]: Secondary | ICD-10-CM

## 2017-02-22 DIAGNOSIS — I493 Ventricular premature depolarization: Secondary | ICD-10-CM

## 2017-02-22 DIAGNOSIS — R001 Bradycardia, unspecified: Secondary | ICD-10-CM | POA: Diagnosis not present

## 2017-02-22 DIAGNOSIS — R002 Palpitations: Secondary | ICD-10-CM

## 2017-02-22 HISTORY — DX: Abnormal electrocardiogram (ECG) (EKG): R94.31

## 2017-02-22 NOTE — Progress Notes (Signed)
   PCP: Creola Cornusso, John, MD   Primary EP: Dr Roxanna MewAllred  Kolter Neyland is a 62 y.o. male who presents today for routine electrophysiology followup.  Since last being seen in our clinic, the patient reports doing very well. He remains active.  Continues to exercise daily.  + rare palpitations which he describes as a "skipped beat, followed by a forceful beat".  Today, he denies symptoms of chest pain, shortness of breath,  lower extremity edema, dizziness, presyncope, or syncope.  The patient is otherwise without complaint today.   Past Medical History:  Diagnosis Date  . Anal fissure   . Anxiety   . Arthritis    knees, ankles, hip , back   . GERD (gastroesophageal reflux disease)   . Gout   . Premature atrial contractions   . Premature ventricular contraction   . Sinus bradycardia    asymptomatic  . Vertigo    one episode   Past Surgical History:  Procedure Laterality Date  . COLONOSCOPY     [id number]  . right hip replacement  11/14  . SPHINCTEROTOMY      ROS- all systems are reviewed and negatives except as per HPI above  Current Outpatient Medications  Medication Sig Dispense Refill  . allopurinol (ZYLOPRIM) 300 MG tablet Take 300 mg by mouth daily.  0  . meloxicam (MOBIC) 7.5 MG tablet Take 7.5 mg by mouth daily as needed. (PAIN)     No current facility-administered medications for this visit.     Physical Exam: Vitals:   02/22/17 0934  BP: 122/60  Pulse: (!) 51  Weight: 194 lb (88 kg)  Height: 6\' 1"  (1.854 m)    GEN- The patient is well appearing, alert and oriented x 3 today.   Head- normocephalic, atraumatic Eyes-  Sclera clear, conjunctiva pink Ears- hearing intact Oropharynx- clear Lungs- Clear to ausculation bilaterally, normal work of breathing Heart- Regular rate and rhythm, no murmurs, rubs or gallops, PMI not laterally displaced GI- soft, NT, ND, + BS Extremities- no clubbing, cyanosis, or edema  EKG tracing ordered today is personally reviewed and  shows sinus rhythm 51 bpm, PR 212 msec, repolarization abnormality  Echo 10/06/15 reveals EF 60%, trivial AI, mild LA enlargement  Assessment and Plan:  1. Palpitations History is suggestive of PVCs Will obtain 48 hour holter to evaluate ectopy burden Currently, does not sound like frequency warrants medical therapy We discussed lifestyle modification including stress avoidance, adequate sleep, and avoidance of caffeine/ ETOH Echo to evaluate for structural heart disease as a cause No symptoms of ischemia during regular exercise.  I will therefore not order stress testing at this time  2. Sinus bradycardia asymptomatic  Follow-up with Dr Timothy Lassousso as scheduled I will see as needed going forward  Hillis RangeJames Del Overfelt MD, [facility] CenterFACC 02/22/2017 10:15 AM

## 2017-02-22 NOTE — Patient Instructions (Addendum)
Medication Instructions:  Your physician recommends that you continue on your current medications as directed. Please refer to the Current Medication list given to you today.  * If you need a refill on your cardiac medications before your next appointment, please call your pharmacy. *  Labwork: Today: BMET, TSH & Magnesium level   * Routine lab work today.  Will notify you of abnormal results, otherwise no news is good news! *   Testing/Procedures: Your physician has recommended that you wear a 48 hour holter monitor. Holter monitors are medical devices that record the heart's electrical activity. Doctors most often use these monitors to diagnose arrhythmias. Arrhythmias are problems with the speed or rhythm of the heartbeat. The monitor is a small, portable device. You can wear one while you do your normal daily activities. This is usually used to diagnose what is causing palpitations/syncope (passing out).  Your physician has requested that you have an echocardiogram. Echocardiography is a painless test that uses sound waves to create images of your heart. It provides your doctor with information about the size and shape of your heart and how well your heart's chambers and valves are working. This procedure takes approximately one hour. There are no restrictions for this procedure.  Follow-Up: No follow up is needed at this time with Dr. Johney FrameAllred.  He will see you on an as needed basis.  Thank you for choosing [facility]!!   Dory HornSherri Price, RN [phone]  Any Other Special Instructions Will Be Listed Below (If Applicable).

## 2017-02-23 LAB — BASIC METABOLIC PANEL
BUN / CREAT RATIO: 15 (ref 10–24)
BUN: 14 mg/dL (ref 8–27)
CO2: 22 mmol/L (ref 20–29)
Calcium: 9.5 mg/dL (ref 8.6–10.2)
Chloride: 104 mmol/L (ref 96–106)
Creatinine, Ser: 0.95 mg/dL (ref 0.76–1.27)
GFR calc Af Amer: 99 mL/min/{1.73_m2} (ref >59)
GFR calc non Af Amer: 86 mL/min/{1.73_m2} (ref >59)
GLUCOSE: 92 mg/dL (ref 65–99)
POTASSIUM: 4.8 mmol/L (ref 3.5–5.2)
SODIUM: 142 mmol/L (ref 134–144)

## 2017-02-23 LAB — TSH: TSH: 2.78 u[IU]/mL (ref 0.450–4.500)

## 2017-02-23 LAB — MAGNESIUM: Magnesium: 2.2 mg/dL (ref 1.6–2.3)

## 2017-03-02 ENCOUNTER — Other Ambulatory Visit: Payer: Self-pay

## 2017-03-02 ENCOUNTER — Ambulatory Visit (INDEPENDENT_AMBULATORY_CARE_PROVIDER_SITE_OTHER): Payer: BLUE CROSS/BLUE SHIELD

## 2017-03-02 ENCOUNTER — Ambulatory Visit (HOSPITAL_COMMUNITY): Payer: BLUE CROSS/BLUE SHIELD | Attending: Cardiology

## 2017-03-02 DIAGNOSIS — I517 Cardiomegaly: Secondary | ICD-10-CM | POA: Insufficient documentation

## 2017-03-02 DIAGNOSIS — R002 Palpitations: Secondary | ICD-10-CM | POA: Insufficient documentation

## 2017-03-02 DIAGNOSIS — I493 Ventricular premature depolarization: Secondary | ICD-10-CM

## 2017-03-02 DIAGNOSIS — I77810 Thoracic aortic ectasia: Secondary | ICD-10-CM | POA: Insufficient documentation

## 2017-03-02 LAB — ECHOCARDIOGRAM COMPLETE
CHL CUP MV DEC (S): 394
CHL CUP RV SYS PRESS: 31 mmHg
CHL CUP TV REG PEAK VELOCITY: 198 cm/s
E/e' ratio: 5.58
EWDT: 394 ms
FS: 35 % (ref 28–44)
IV/PV OW: 1.18
LA ID, A-P, ES: 39 mm
LA vol A4C: 48 ml
LA vol: 57.4 mL
LADIAMINDEX: 1.84 cm/m2
LAVOLIN: 27.1 mL/m2
LEFT ATRIUM END SYS DIAM: 39 mm
LV E/e' medial: 5.58
LV E/e'average: 5.58
LV PW d: 11 mm — AB (ref 0.6–1.1)
LVELAT: 10 cm/s
LVOT VTI: 20.1 cm
LVOT area: 4.15 cm2
LVOT peak vel: 108 cm/s
LVOTD: 23 mm
LVOTSV: 83 mL
MV pk A vel: 48.5 m/s
MV pk E vel: 55.8 m/s
P 1/2 time: 1060 ms
RV LATERAL S' VELOCITY: 10.9 cm/s
TAPSE: 34.5 mm
TDI e' lateral: 10
TDI e' medial: 7.78
TRMAXVEL: 198 cm/s

## 2017-03-14 ENCOUNTER — Telehealth: Payer: Self-pay | Admitting: Internal Medicine

## 2017-03-14 NOTE — Telephone Encounter (Signed)
New Message:   Pt is calling to get results of labs states it has been over a week and he has not received a call with results yet.

## 2017-03-14 NOTE — Telephone Encounter (Signed)
Made the pt aware of his echo results, lab results per Dr Johney FrameAllred.  Pt verbalized understanding.

## 2017-03-18 ENCOUNTER — Encounter: Payer: Self-pay | Admitting: Internal Medicine

## 2017-03-30 ENCOUNTER — Telehealth: Payer: Self-pay

## 2017-03-30 NOTE — Telephone Encounter (Signed)
Returned call to Pt.  Discussed all recent work up results including results and why they were ordered.  Pt was pleased with phone call, indicates understanding of all results.  Knows when he should give this office a call for further intervention.  Pt understands at this time results indicate Pt's heart is healthy.  Encouraged Pt to continue to exercise, reduce salt, avoid ETOH and caffeine.  Pt indicates understanding.  Thanked this nurse for call.

## 2017-07-10 DIAGNOSIS — Z125 Encounter for screening for malignant neoplasm of prostate: Secondary | ICD-10-CM | POA: Diagnosis not present

## 2017-07-10 DIAGNOSIS — M859 Disorder of bone density and structure, unspecified: Secondary | ICD-10-CM | POA: Diagnosis not present

## 2017-07-10 DIAGNOSIS — Z0000 Encounter for general adult medical examination without abnormal findings: Secondary | ICD-10-CM | POA: Diagnosis not present

## 2017-07-10 DIAGNOSIS — M109 Gout, unspecified: Secondary | ICD-10-CM | POA: Diagnosis not present

## 2017-07-19 DIAGNOSIS — Z125 Encounter for screening for malignant neoplasm of prostate: Secondary | ICD-10-CM | POA: Diagnosis not present

## 2017-07-19 DIAGNOSIS — Z1389 Encounter for screening for other disorder: Secondary | ICD-10-CM | POA: Diagnosis not present

## 2017-07-19 DIAGNOSIS — R82998 Other abnormal findings in urine: Secondary | ICD-10-CM | POA: Diagnosis not present

## 2017-07-19 DIAGNOSIS — R001 Bradycardia, unspecified: Secondary | ICD-10-CM | POA: Diagnosis not present

## 2017-07-19 DIAGNOSIS — M109 Gout, unspecified: Secondary | ICD-10-CM | POA: Diagnosis not present

## 2017-07-19 DIAGNOSIS — Z0000 Encounter for general adult medical examination without abnormal findings: Secondary | ICD-10-CM | POA: Diagnosis not present

## 2017-07-19 DIAGNOSIS — M1990 Unspecified osteoarthritis, unspecified site: Secondary | ICD-10-CM | POA: Diagnosis not present

## 2017-07-19 DIAGNOSIS — M859 Disorder of bone density and structure, unspecified: Secondary | ICD-10-CM | POA: Diagnosis not present

## 2017-07-21 ENCOUNTER — Other Ambulatory Visit: Payer: Self-pay | Admitting: Internal Medicine

## 2017-07-21 ENCOUNTER — Ambulatory Visit
Admission: RE | Admit: 2017-07-21 | Discharge: 2017-07-21 | Disposition: A | Discharge status: Home / Self Care | Payer: BLUE CROSS/BLUE SHIELD | Source: Ambulatory Visit | Attending: Internal Medicine | Admitting: Internal Medicine

## 2017-07-21 DIAGNOSIS — R3121 Asymptomatic microscopic hematuria: Secondary | ICD-10-CM

## 2017-07-21 DIAGNOSIS — N200 Calculus of kidney: Secondary | ICD-10-CM | POA: Diagnosis not present

## 2017-07-21 DIAGNOSIS — Z1212 Encounter for screening for malignant neoplasm of rectum: Secondary | ICD-10-CM | POA: Diagnosis not present

## 2017-07-27 DIAGNOSIS — D1801 Hemangioma of skin and subcutaneous tissue: Secondary | ICD-10-CM | POA: Diagnosis not present

## 2017-07-27 DIAGNOSIS — D225 Melanocytic nevi of trunk: Secondary | ICD-10-CM | POA: Diagnosis not present

## 2017-07-27 DIAGNOSIS — R311 Benign essential microscopic hematuria: Secondary | ICD-10-CM | POA: Diagnosis not present

## 2017-07-27 DIAGNOSIS — D492 Neoplasm of unspecified behavior of bone, soft tissue, and skin: Secondary | ICD-10-CM | POA: Diagnosis not present

## 2017-07-27 DIAGNOSIS — D485 Neoplasm of uncertain behavior of skin: Secondary | ICD-10-CM | POA: Diagnosis not present

## 2017-07-27 DIAGNOSIS — D2261 Melanocytic nevi of right upper limb, including shoulder: Secondary | ICD-10-CM | POA: Diagnosis not present

## 2017-07-27 DIAGNOSIS — L821 Other seborrheic keratosis: Secondary | ICD-10-CM | POA: Diagnosis not present

## 2017-07-27 DIAGNOSIS — N35011 Post-traumatic bulbous urethral stricture: Secondary | ICD-10-CM | POA: Diagnosis not present

## 2017-08-01 ENCOUNTER — Other Ambulatory Visit: Payer: Self-pay | Admitting: Urology

## 2017-08-22 ENCOUNTER — Other Ambulatory Visit: Payer: Self-pay

## 2017-08-22 ENCOUNTER — Encounter (HOSPITAL_BASED_OUTPATIENT_CLINIC_OR_DEPARTMENT_OTHER): Payer: Self-pay

## 2017-08-22 NOTE — Progress Notes (Signed)
Spoke with:  David BrownsAnthony NPO:  After Midnight, no gum, candy, or mints   Arrival time:  0915AM Labs: N/A (EKG 02/22/2017 in epic) AM medications:  None Pre op orders: Yes Ride home:  David SandyBeth (wife) [phone]

## 2017-08-24 DIAGNOSIS — N200 Calculus of kidney: Secondary | ICD-10-CM | POA: Diagnosis not present

## 2017-08-24 DIAGNOSIS — N35011 Post-traumatic bulbous urethral stricture: Secondary | ICD-10-CM | POA: Diagnosis not present

## 2017-09-04 ENCOUNTER — Encounter (HOSPITAL_BASED_OUTPATIENT_CLINIC_OR_DEPARTMENT_OTHER): Payer: Self-pay | Admitting: *Deleted

## 2017-09-04 ENCOUNTER — Ambulatory Visit (HOSPITAL_BASED_OUTPATIENT_CLINIC_OR_DEPARTMENT_OTHER)
Admission: RE | Admit: 2017-09-04 | Discharge: 2017-09-04 | Disposition: A | Discharge status: Home / Self Care | Payer: BLUE CROSS/BLUE SHIELD | Source: Ambulatory Visit | Attending: Urology | Admitting: Urology

## 2017-09-04 ENCOUNTER — Encounter (HOSPITAL_BASED_OUTPATIENT_CLINIC_OR_DEPARTMENT_OTHER)
Admission: RE | Disposition: A | Discharge status: Home / Self Care | Payer: Self-pay | Source: Ambulatory Visit | Attending: Urology

## 2017-09-04 ENCOUNTER — Ambulatory Visit (HOSPITAL_BASED_OUTPATIENT_CLINIC_OR_DEPARTMENT_OTHER): Payer: BLUE CROSS/BLUE SHIELD | Admitting: Anesthesiology

## 2017-09-04 ENCOUNTER — Other Ambulatory Visit: Payer: Self-pay

## 2017-09-04 DIAGNOSIS — Z8249 Family history of ischemic heart disease and other diseases of the circulatory system: Secondary | ICD-10-CM | POA: Insufficient documentation

## 2017-09-04 DIAGNOSIS — N35919 Unspecified urethral stricture, male, unspecified site: Secondary | ICD-10-CM | POA: Diagnosis not present

## 2017-09-04 DIAGNOSIS — M19071 Primary osteoarthritis, right ankle and foot: Secondary | ICD-10-CM | POA: Diagnosis not present

## 2017-09-04 DIAGNOSIS — N35812 Other urethral bulbous stricture, male: Secondary | ICD-10-CM | POA: Diagnosis not present

## 2017-09-04 DIAGNOSIS — I493 Ventricular premature depolarization: Secondary | ICD-10-CM | POA: Insufficient documentation

## 2017-09-04 DIAGNOSIS — M170 Bilateral primary osteoarthritis of knee: Secondary | ICD-10-CM | POA: Diagnosis not present

## 2017-09-04 DIAGNOSIS — K219 Gastro-esophageal reflux disease without esophagitis: Secondary | ICD-10-CM | POA: Insufficient documentation

## 2017-09-04 DIAGNOSIS — Z96641 Presence of right artificial hip joint: Secondary | ICD-10-CM | POA: Insufficient documentation

## 2017-09-04 DIAGNOSIS — M479 Spondylosis, unspecified: Secondary | ICD-10-CM | POA: Insufficient documentation

## 2017-09-04 DIAGNOSIS — M19072 Primary osteoarthritis, left ankle and foot: Secondary | ICD-10-CM | POA: Diagnosis not present

## 2017-09-04 DIAGNOSIS — N200 Calculus of kidney: Secondary | ICD-10-CM | POA: Insufficient documentation

## 2017-09-04 DIAGNOSIS — F419 Anxiety disorder, unspecified: Secondary | ICD-10-CM | POA: Insufficient documentation

## 2017-09-04 DIAGNOSIS — I491 Atrial premature depolarization: Secondary | ICD-10-CM | POA: Insufficient documentation

## 2017-09-04 HISTORY — PX: CYSTOSCOPY WITH RETROGRADE PYELOGRAM, URETEROSCOPY AND STENT PLACEMENT: SHX5789

## 2017-09-04 HISTORY — DX: Presence of spectacles and contact lenses: Z97.3

## 2017-09-04 HISTORY — DX: Hematuria, unspecified: R31.9

## 2017-09-04 HISTORY — DX: Abnormal electrocardiogram (ECG) (EKG): R94.31

## 2017-09-04 HISTORY — DX: Personal history of urinary calculi: Z87.442

## 2017-09-04 HISTORY — PX: CYSTOSCOPY WITH URETHRAL DILATATION: SHX5125

## 2017-09-04 HISTORY — DX: Palpitations: R00.2

## 2017-09-04 SURGERY — CYSTOURETEROSCOPY, WITH RETROGRADE PYELOGRAM AND STENT INSERTION
Anesthesia: General | Site: Urethra

## 2017-09-04 MED ORDER — MIDAZOLAM HCL 2 MG/2ML IJ SOLN
INTRAMUSCULAR | Status: DC | PRN
  Administered 2017-09-04: 2 mg via INTRAVENOUS

## 2017-09-04 MED ORDER — OXYCODONE-ACETAMINOPHEN 5-325 MG PO TABS
1.0000 | ORAL_TABLET | ORAL | Status: DC | PRN
  Administered 2017-09-04: 1 via ORAL
  Filled 2017-09-04: qty 1

## 2017-09-04 MED ORDER — ARTIFICIAL TEARS OPHTHALMIC OINT
TOPICAL_OINTMENT | OPHTHALMIC | Status: AC
  Filled 2017-09-04: qty 7

## 2017-09-04 MED ORDER — PROPOFOL 10 MG/ML IV BOLUS
INTRAVENOUS | Status: DC | PRN
  Administered 2017-09-04: 170 mg via INTRAVENOUS
  Administered 2017-09-04: 30 mg via INTRAVENOUS

## 2017-09-04 MED ORDER — ONDANSETRON HCL 4 MG/2ML IJ SOLN
INTRAMUSCULAR | Status: AC
  Filled 2017-09-04: qty 2

## 2017-09-04 MED ORDER — ACETAMINOPHEN 500 MG PO TABS
ORAL_TABLET | ORAL | Status: AC
  Filled 2017-09-04: qty 2

## 2017-09-04 MED ORDER — KETOROLAC TROMETHAMINE 30 MG/ML IJ SOLN
INTRAMUSCULAR | Status: AC
  Filled 2017-09-04: qty 1

## 2017-09-04 MED ORDER — LIDOCAINE 2% (20 MG/ML) 5 ML SYRINGE
INTRAMUSCULAR | Status: AC
  Filled 2017-09-04: qty 5

## 2017-09-04 MED ORDER — OXYCODONE-ACETAMINOPHEN 5-325 MG PO TABS
ORAL_TABLET | ORAL | Status: AC
  Filled 2017-09-04: qty 1

## 2017-09-04 MED ORDER — OXYCODONE-ACETAMINOPHEN 5-325 MG PO TABS: 1.0000 | ORAL_TABLET | ORAL | 0 refills | Status: AC | PRN

## 2017-09-04 MED ORDER — LIDOCAINE 2% (20 MG/ML) 5 ML SYRINGE
INTRAMUSCULAR | Status: DC | PRN
  Administered 2017-09-04: 80 mg via INTRAVENOUS

## 2017-09-04 MED ORDER — LACTATED RINGERS IV SOLN
INTRAVENOUS | Status: DC
  Administered 2017-09-04 (×2): via INTRAVENOUS
  Filled 2017-09-04: qty 1000

## 2017-09-04 MED ORDER — FENTANYL CITRATE (PF) 100 MCG/2ML IJ SOLN
INTRAMUSCULAR | Status: DC | PRN
  Administered 2017-09-04: 50 ug via INTRAVENOUS

## 2017-09-04 MED ORDER — ACETAMINOPHEN 500 MG PO TABS
1000.0000 mg | ORAL_TABLET | Freq: Once | ORAL | Status: AC
  Administered 2017-09-04: 1000 mg via ORAL
  Filled 2017-09-04: qty 2

## 2017-09-04 MED ORDER — DEXAMETHASONE SODIUM PHOSPHATE 10 MG/ML IJ SOLN
INTRAMUSCULAR | Status: AC
  Filled 2017-09-04: qty 1

## 2017-09-04 MED ORDER — PROMETHAZINE HCL 25 MG/ML IJ SOLN
6.2500 mg | INTRAMUSCULAR | Status: DC | PRN
  Filled 2017-09-04: qty 1

## 2017-09-04 MED ORDER — MIDAZOLAM HCL 2 MG/2ML IJ SOLN
INTRAMUSCULAR | Status: AC
  Filled 2017-09-04: qty 2

## 2017-09-04 MED ORDER — FENTANYL CITRATE (PF) 100 MCG/2ML IJ SOLN
25.0000 ug | INTRAMUSCULAR | Status: DC | PRN
  Filled 2017-09-04: qty 1

## 2017-09-04 MED ORDER — TAMSULOSIN HCL 0.4 MG PO CAPS: 0.4000 mg | ORAL_CAPSULE | Freq: Every day | ORAL | 0 refills | Status: DC

## 2017-09-04 MED ORDER — KETOROLAC TROMETHAMINE 30 MG/ML IJ SOLN
INTRAMUSCULAR | Status: DC | PRN
  Administered 2017-09-04: 30 mg via INTRAVENOUS

## 2017-09-04 MED ORDER — PHENYLEPHRINE 40 MCG/ML (10ML) SYRINGE FOR IV PUSH (FOR BLOOD PRESSURE SUPPORT)
PREFILLED_SYRINGE | INTRAVENOUS | Status: AC
  Filled 2017-09-04: qty 10

## 2017-09-04 MED ORDER — SODIUM CHLORIDE 0.9 % IR SOLN
Status: DC | PRN
  Administered 2017-09-04: 3000 mL via INTRAVESICAL

## 2017-09-04 MED ORDER — PROPOFOL 10 MG/ML IV BOLUS
INTRAVENOUS | Status: AC
  Filled 2017-09-04: qty 40

## 2017-09-04 MED ORDER — DEXAMETHASONE SODIUM PHOSPHATE 10 MG/ML IJ SOLN
INTRAMUSCULAR | Status: DC | PRN
  Administered 2017-09-04: 10 mg via INTRAVENOUS

## 2017-09-04 MED ORDER — ONDANSETRON HCL 4 MG/2ML IJ SOLN
INTRAMUSCULAR | Status: DC | PRN
  Administered 2017-09-04: 4 mg via INTRAVENOUS

## 2017-09-04 MED ORDER — ROCURONIUM BROMIDE 100 MG/10ML IV SOLN
INTRAVENOUS | Status: AC
  Filled 2017-09-04: qty 3

## 2017-09-04 MED ORDER — IOHEXOL 300 MG/ML  SOLN
INTRAMUSCULAR | Status: DC | PRN
  Administered 2017-09-04: 10 mL

## 2017-09-04 MED ORDER — SUGAMMADEX SODIUM 500 MG/5ML IV SOLN
INTRAVENOUS | Status: AC
  Filled 2017-09-04: qty 5

## 2017-09-04 MED ORDER — CEFAZOLIN SODIUM-DEXTROSE 2-4 GM/100ML-% IV SOLN
2.0000 g | INTRAVENOUS | Status: AC
  Administered 2017-09-04: 2 g via INTRAVENOUS
  Filled 2017-09-04: qty 100

## 2017-09-04 MED ORDER — CEFAZOLIN SODIUM-DEXTROSE 2-4 GM/100ML-% IV SOLN
INTRAVENOUS | Status: AC
  Filled 2017-09-04: qty 100

## 2017-09-04 MED ORDER — ARTIFICIAL TEARS OPHTHALMIC OINT
TOPICAL_OINTMENT | OPHTHALMIC | Status: AC
  Filled 2017-09-04: qty 3.5

## 2017-09-04 MED ORDER — FENTANYL CITRATE (PF) 100 MCG/2ML IJ SOLN
INTRAMUSCULAR | Status: AC
  Filled 2017-09-04: qty 2

## 2017-09-04 MED FILL — OXYCODONE-ACETAMINOPHEN 5-3: 5-325 | 5 days supply | Qty: 30 | Fill #0

## 2017-09-04 MED FILL — TAMSULOSIN HCL 0.4 MG CAP: 0.4 | 30 days supply | Qty: 30 | Fill #0

## 2017-09-04 SURGICAL SUPPLY — 31 items
BAG DRAIN URO-CYSTO SKYTR STRL (DRAIN) ×4 IMPLANT
BAG DRN UROCATH (DRAIN) ×2
BAG URINE DRAINAGE (UROLOGICAL SUPPLIES) ×2 IMPLANT
BAG URINE LEG 500ML (DRAIN) ×2 IMPLANT
BALLN NEPHROSTOMY (BALLOONS) ×4
BALLOON NEPHROSTOMY (BALLOONS) IMPLANT
BASKET STONE 1.7 NGAGE (UROLOGICAL SUPPLIES) IMPLANT
CATH FOLEY 2WAY SLVR  5CC 18FR (CATHETERS) ×2
CATH FOLEY 2WAY SLVR  5CC 20FR (CATHETERS) ×2
CATH FOLEY 2WAY SLVR 5CC 18FR (CATHETERS) IMPLANT
CATH FOLEY 2WAY SLVR 5CC 20FR (CATHETERS) IMPLANT
CATH INTERMIT  6FR 70CM (CATHETERS) ×2 IMPLANT
CLOTH BEACON ORANGE TIMEOUT ST (SAFETY) ×4 IMPLANT
EXTRACTOR STONE 1.7FRX115CM (UROLOGICAL SUPPLIES) IMPLANT
FIBER LASER TRAC TIP (UROLOGICAL SUPPLIES) IMPLANT
GLOVE BIO SURGEON STRL SZ8 (GLOVE) ×6 IMPLANT
GOWN STRL REUS W/ TWL XL LVL3 (GOWN DISPOSABLE) ×2 IMPLANT
GOWN STRL REUS W/TWL XL LVL3 (GOWN DISPOSABLE) ×8 IMPLANT
GUIDEWIRE ANG ZIPWIRE 038X150 (WIRE) ×4 IMPLANT
GUIDEWIRE STR DUAL SENSOR (WIRE) IMPLANT
IV NS IRRIG 3000ML ARTHROMATIC (IV SOLUTION) ×4 IMPLANT
KIT TURNOVER CYSTO (KITS) ×4 IMPLANT
MANIFOLD NEPTUNE II (INSTRUMENTS) ×4 IMPLANT
NS IRRIG 500ML POUR BTL (IV SOLUTION) ×4 IMPLANT
PACK CYSTO (CUSTOM PROCEDURE TRAY) ×4 IMPLANT
SHEATH URETERAL 12FRX35CM (MISCELLANEOUS) ×2 IMPLANT
STENT URET 6FRX26 CONTOUR (STENTS) IMPLANT
SYR 10ML LL (SYRINGE) ×4 IMPLANT
TUBE CONNECTING 12'X1/4 (SUCTIONS) ×1
TUBE CONNECTING 12X1/4 (SUCTIONS) ×1 IMPLANT
TUBING UROLOGY SET (TUBING) ×2 IMPLANT

## 2017-09-04 NOTE — Anesthesia Postprocedure Evaluation (Signed)
Anesthesia Post Note  Patient: Roxy Cedarnthony Penner  Procedure(s) Performed: CYSTOSCOPY WITH RETROGRADE PYELOGRAM, URETEROSCOPY, LASER LITHOTRIPSY  AND STENT PLACEMENT (Left Renal) CYSTOSCOPY WITH URETHRAL DILATATION (N/A Urethra)     Patient location during evaluation: PACU Anesthesia Type: General Level of consciousness: awake and alert, oriented and awake Pain management: pain level controlled Vital Signs Assessment: post-procedure vital signs reviewed and stable Respiratory status: spontaneous breathing, nonlabored ventilation and respiratory function stable Cardiovascular status: blood pressure returned to baseline and stable Postop Assessment: no apparent nausea or vomiting Anesthetic complications: no    Last Vitals:  Vitals:   09/04/17 1315 09/04/17 1445  BP: 127/81 126/73  Pulse: (!) 42 (!) 44  Resp: 20 16  Temp:  36.8 C  SpO2: 99% 100%    Last Pain:  Vitals:   09/04/17 1345  TempSrc:   PainSc: 3                  Cecile HearingStephen Edward Turk

## 2017-09-04 NOTE — Anesthesia Preprocedure Evaluation (Addendum)
Anesthesia Evaluation  Patient identified by MRN, date of birth, ID band Patient awake    Reviewed: Allergy & Precautions, NPO status , Patient's Chart, lab work & pertinent test results  Airway Mallampati: II  TM Distance: >3 FB Neck ROM: Full    Dental  (+) Teeth Intact, Dental Advisory Given   Pulmonary neg pulmonary ROS,    Pulmonary exam normal breath sounds clear to auscultation       Cardiovascular negative cardio ROS Normal cardiovascular exam Rhythm:Regular Rate:Normal     Neuro/Psych PSYCHIATRIC DISORDERS Anxiety negative neurological ROS     GI/Hepatic Neg liver ROS, GERD  Medicated and Controlled,  Endo/Other  negative endocrine ROS  Renal/GU negative Renal ROS   URETHRAL STRICTURE AND LEFT RENAL STONE    Musculoskeletal  (+) Arthritis ,   Abdominal   Peds  Hematology negative hematology ROS (+)   Anesthesia Other Findings Day of surgery medications reviewed with the patient.  Reproductive/Obstetrics                            Anesthesia Physical Anesthesia Plan  ASA: II  Anesthesia Plan: General   Post-op Pain Management:    Induction: Intravenous  PONV Risk Score and Plan: 3 and Midazolam, Ondansetron and Dexamethasone  Airway Management Planned: LMA  Additional Equipment:   Intra-op Plan:   Post-operative Plan: Extubation in OR  Informed Consent: I have reviewed the patients History and Physical, chart, labs and discussed the procedure including the risks, benefits and alternatives for the proposed anesthesia with the patient or authorized representative who has indicated his/her understanding and acceptance.   Dental advisory given  Plan Discussed with: CRNA  Anesthesia Plan Comments:         Anesthesia Quick Evaluation

## 2017-09-04 NOTE — Op Note (Signed)
Preoperative diagnosis: urethral stricture, left renal calculus  Postoperative diagnosis: Same  Procedure: 1. Cystoscopy 2. Balloon dilation of urethral stricture 3. Left retrograde pyelography 4.  Intraoperative fluoroscopy, under one hour, with interpretation 5.  Left ureteroscopic stone manipulation with laser lithotripsy 6.  Left 6 x 26 JJ stent placement 7. Foley catheter placement  Attending: Wilkie AyePatrick Danaye Sobh  Anesthesia: General  History of blood loss: Minimal  Antibiotics: Ancef  Specimens: none  Drains: 18 french silicone catheter 6x26 JJ ureteral stent with tether  Findings: dense bulbar urethral stricture.  No masses/lesions in the bladder. Left mid pole renal calculus  Indications: Patient is a 62 year old male with a history of difficulty with urination and was found to have a urethral stricture. He also has a 7mm left renal calculus. After discussing treatment options the patient wishes to proceed with  Balloon dilation of his stricture and left ureteroscopic stone extraction.  Procedure in detail: Prior to procedure consent was obtained.  Patient was brought to the operating room and a brief timeout was done to ensure correct patient, correct procedure, correct site.  General anesthesia was administered and patient was placed in dorsal lithotomy position.  His genitalia was then prepped and draped in usual sterile fashion. We then passed a 17 French cystoscope up to the bulbar urethra where we encountered a dense stricture. A sensor was passed into bladder. The cystoscope was removed and we then passed a balloon catheter over the wire across the stricture. Under direct vision we inflated the balloon to 18cm of water. we then deflated the catheter and removed the sensor wire. We then passed the scope into the bladder. We inspected the bladder and no lesions, masses, or calculi were noted. a 6 french ureteral catheter was then instilled into the left ureteral orifice.  a  gentle retrograde was obtained and findings noted above.  we then placed a zip wire through the ureteral catheter and advanced up to the renal pelvis.  we then removed the cystoscope and cannulated the left ureteral orifice with a semirigid ureteroscope.  No stone was found in the ureter. Once we reached the UPJ a sensor wire was advanced in to the renal pelvis. We then removed the ureteroscope and advanced a flexible ureteroscope over the sensor wire. We encountered the stone in the mid pole. using a 200 nm laser fiber the stone was dusted.  once all stone fragments were removed we then placed a 6 x 26 double-j ureteral stent over the original zip wire.  We then removed the wire and good coil was noted in the the renal pelvis under fluoroscopy and the bladder under direct vision.   We then removed the cystoscope and placed an 18 French silicone catheter.  This then concluded the procedure which was well tolerated by the patient.  Complications: None  Condition: Stable, extubated, transferred to PACU.  Plan: Patient is to be discharged home.  He is to follow up in 5 days for voiding trial and stent removal.

## 2017-09-04 NOTE — Anesthesia Procedure Notes (Signed)
Procedure Name: LMA Insertion Date/Time: 09/04/2017 11:46 AM Performed by: Tyrone NineSauve, Hasaan Radde F, CRNA Pre-anesthesia Checklist: Patient identified, Timeout performed, Emergency Drugs available, Suction available and Patient being monitored Patient Re-evaluated:Patient Re-evaluated prior to induction Oxygen Delivery Method: Circle system utilized Preoxygenation: Pre-oxygenation with 100% oxygen Induction Type: IV induction Ventilation: Mask ventilation without difficulty LMA: LMA inserted LMA Size: 4.0 Number of attempts: 1 Placement Confirmation: breath sounds checked- equal and bilateral,  CO2 detector and positive ETCO2 Tube secured with: Tape Dental Injury: Teeth and Oropharynx as per pre-operative assessment

## 2017-09-04 NOTE — Discharge Instructions (Signed)
Indwelling Urinary Catheter Care, Adult °Take good care of your catheter to keep it working and to prevent problems. °How to wear your catheter °Attach your catheter to your leg with tape (adhesive tape) or a leg strap. Make sure it is not too tight. If you use tape, remove any bits of tape that are already on the catheter. °How to wear a drainage bag °You should have: °· A large overnight bag. °· A small leg bag. ° °Overnight Bag °You may wear the overnight bag at any time. Always keep the bag below the level of your bladder but off the floor. When you sleep, put a clean plastic bag in a wastebasket. Then hang the bag inside the wastebasket. °Leg Bag °Never wear the leg bag at night. Always wear the leg bag below your knee. Keep the leg bag secure with a leg strap or tape. °How to care for your skin °· Clean the skin around the catheter at least once every day. °· Shower every day. Do not take baths. °· Put creams, lotions, or ointments on your genital area only as told by your doctor. °· Do not use powders, sprays, or lotions on your genital area. °How to clean your catheter and your skin °1. Wash your hands with soap and water. °2. Wet a washcloth in warm water and gentle (mild) soap. °3. Use the washcloth to clean the skin where the catheter enters your body. Clean downward and wipe away from the catheter in small circles. Do not wipe toward the catheter. °4. Pat the area dry with a clean towel. Make sure to clean off all soap. °How to care for your drainage bags °Empty your drainage bag when it is ?-½ full or at least 2-3 times a day. Replace your drainage bag once a month or sooner if it starts to smell bad or look dirty. Do not clean your drainage bag unless told by your doctor. °Emptying a drainage bag ° °Supplies Needed °· Rubbing alcohol. °· Gauze pad or cotton ball. °· Tape or a leg strap. ° °Steps °1. Wash your hands with soap and water. °2. Separate (detach) the bag from your leg. °3. Hold the bag over  the toilet or a clean container. Keep the bag below your hips and bladder. This stops pee (urine) from going back into the tube. °4. Open the pour spout at the bottom of the bag. °5. Empty the pee into the toilet or container. Do not let the pour spout touch any surface. °6. Put rubbing alcohol on a gauze pad or cotton ball. °7. Use the gauze pad or cotton ball to clean the pour spout. °8. Close the pour spout. °9. Attach the bag to your leg with tape or a leg strap. °10. Wash your hands. ° °Changing a drainage bag °Supplies Needed °· Alcohol wipes. °· A clean drainage bag. °· Adhesive tape or a leg strap. ° °Steps °1. Wash your hands with soap and water. °2. Separate the dirty bag from your leg. °3. Pinch the rubber catheter with your fingers so that pee does not spill out. °4. Separate the catheter tube from the drainage tube where these tubes connect (at the connection valve). Do not let the tubes touch any surface. °5. Clean the end of the catheter tube with an alcohol wipe. Use a different alcohol wipe to clean the end of the drainage tube. °6. Connect the catheter tube to the drainage tube of the clean bag. °7. Attach the new bag to   the leg with adhesive tape or a leg strap. °8. Wash your hands. ° °How to prevent infection and other problems °· Never pull on your catheter or try to remove it. Pulling can damage tissue in your body. °· Always wash your hands before and after touching your catheter. °· If a leg strap gets wet, replace it with a dry one. °· Drink enough fluids to keep your pee clear or pale yellow, or as told by your doctor. °· Do not let the drainage bag or tubing touch the floor. °· Wear cotton underwear. °· If you are male, wipe from front to back after you poop (have a bowel movement). °· Check on the catheter often to make sure it works and the tubing is not twisted. °Get help if: °· Your pee is cloudy. °· Your pee smells unusually bad. °· Your pee is not draining into the bag. °· Your  tube gets clogged. °· Your catheter starts to leak. °· Your bladder feels full. °Get help right away if: °· You have redness, swelling, or pain where the catheter enters your body. °· You have fluid, pus, or a bad smell coming from the area where the catheter enters your body. °· The area where the catheter enters your body feels warm. °· You have a fever. °· You have pain in your: °? Stomach (abdomen). °? Legs. °? Lower back. °? Bladder. °· You see blood fill the catheter. °· Your pee is pink or red. °· You feel sick to your stomach (nauseous). °· You throw up (vomit). °· You have chills. °· Your catheter gets pulled out. °This information is not intended to replace advice given to you by your health care provider. Make sure you discuss any questions you have with your health care provider. °Document Released: 04/23/2012 Document Revised: 11/25/2015 Document Reviewed: 06/11/2013 °Elsevier Interactive Patient Education © 2018 [employer] ° ° °Post Anesthesia Home Care Instructions ° °Activity: °Get plenty of rest for the remainder of the day. A responsible individual must stay with you for 24 hours following the procedure.  °For the next 24 hours, DO NOT: °-Drive a car °-Operate machinery °-Drink alcoholic beverages °-Take any medication unless instructed by your physician °-Make any legal decisions or sign important papers. ° °Meals: °Start with liquid foods such as gelatin or soup. Progress to regular foods as tolerated. Avoid greasy, spicy, heavy foods. If nausea and/or vomiting occur, drink only clear liquids until the nausea and/or vomiting subsides. Call your physician if vomiting continues. ° °Special Instructions/Symptoms: °Your throat may feel dry or sore from the anesthesia or the breathing tube placed in your throat during surgery. If this causes discomfort, gargle with warm salt water. The discomfort should disappear within 24 hours. ° ° °  ° ° °

## 2017-09-04 NOTE — H&P (Signed)
Urology Admission H&P  Chief Complaint: dysuria  History of Present Illness: Mr David Williamson is a 62yo with a hx of nephrolithiasis and progressive LUTS who was found to have a uretrhal stricture on office cystoscopy. He has a 7mm left renal calculus also. No flank pain. He has dysuria, urinary frequency and urgency  Past Medical History:  Diagnosis Date  . Abnormal EKG 02/22/2017  . Anal fissure   . Anxiety   . Arthritis    knees, ankles, hip , back   . GERD (gastroesophageal reflux disease)   . Gout   . Heart palpitations    Rare  . Hematuria   . History of kidney stones   . Premature atrial contractions   . Premature ventricular contraction   . Sinus bradycardia    asymptomatic  . Vertigo    one episode  . Wears glasses    Past Surgical History:  Procedure Laterality Date  . COLONOSCOPY     [id number]  . right hip replacement  11/14  . SPHINCTEROTOMY      Home Medications:  Current Facility-Administered Medications  Medication Dose Route Frequency Provider Last Rate Last Dose  . ceFAZolin (ANCEF) IVPB 2g/100 mL premix  2 g Intravenous 30 min Pre-Op Ronne BinningMcKenzie, Mardene CelestePatrick L, MD      . lactated ringers infusion   Intravenous Continuous Cecile Hearingurk, Stephen Edward, MD 50 mL/hr at 09/04/17 [phone]    . sodium chloride irrigation 0.9 %    PRN Malen GauzeMcKenzie, Patrick L, MD   3,000 mL at 09/04/17 1128   Allergies: No Known Allergies  Family History  Problem Relation Age of Onset  . Kidney cancer Father   . Diabetes Mellitus II Mother   . Coronary artery disease Mother        died post op day one from CABG  . Kidney cancer Brother   . Hypertension Unknown   . Colon cancer Neg Hx   . Colon polyps Neg Hx   . Esophageal cancer Neg Hx   . Rectal cancer Neg Hx   . Stomach cancer Neg Hx    Social History:  reports that he has never smoked. He has never used smokeless tobacco. He reports that he drinks about 14.0 standard drinks of alcohol per week. He reports that he does not use drugs.  Review  of Systems  Genitourinary: Positive for dysuria, frequency and urgency.  All other systems reviewed and are negative.   Physical Exam:  Vital signs in last 24 hours: Temp:  [98.6 F (37 C)] 98.6 F (37 C) (08/26 0927) Pulse Rate:  [53] 53 (08/26 0927) Resp:  [17] 17 (08/26 0927) BP: (130)/(79) 130/79 (08/26 0927) SpO2:  [98 %] 98 % (08/26 0927) Weight:  [82.7 kg] 82.7 kg (08/26 0927) Physical Exam  Constitutional: He is oriented to person, place, and time. He appears well-developed and well-nourished.  HENT:  Head: Normocephalic and atraumatic.  Eyes: Pupils are equal, round, and reactive to light. EOM are normal.  Neck: Normal range of motion. No thyromegaly present.  Cardiovascular: Normal rate and regular rhythm.  Respiratory: Effort normal. No respiratory distress.  GI: Soft. He exhibits no distension.  Musculoskeletal: Normal range of motion. He exhibits no edema.  Neurological: He is alert and oriented to person, place, and time.  Skin: Skin is warm and dry.  Psychiatric: He has a normal mood and affect. His behavior is normal. Judgment and thought content normal.    Laboratory Data:  No results found for this or any  previous visit (from the past 24 hour(s)). No results found for this or any previous visit (from the past 240 hour(s)). Creatinine: No results for input(s): CREATININE in the last 168 hours. Baseline Creatinine: unknown  Impression/Assessment:  62yo with urethral stricture and nephrolithiasis  Plan:  The risks/benefits/alternatives to balloon dilation of his stricture and left ureteroscopic stone extraction was explained to the patient and he understands and wishes to proceed with surgery  Wilkie Aye 09/04/2017, 11:32 AM

## 2017-09-04 NOTE — Transfer of Care (Signed)
Immediate Anesthesia Transfer of Care Note  Patient: David Williamson  Procedure(s) Performed: CYSTOSCOPY WITH RETROGRADE PYELOGRAM, URETEROSCOPY, LASER LITHOTRIPSY  AND STENT PLACEMENT (Left Renal) CYSTOSCOPY WITH URETHRAL DILATATION (N/A Urethra)  Patient Location: PACU  Anesthesia Type:General  Level of Consciousness: awake, alert , oriented and patient cooperative  Airway & Oxygen Therapy: Patient Spontanous Breathing and Patient connected to nasal cannula oxygen  Post-op Assessment: Report given to RN and Post -op Vital signs reviewed and stable  Post vital signs: Reviewed and stable  Last Vitals:  Vitals Value Taken Time  BP    Temp    Pulse    Resp    SpO2      Last Pain:  Vitals:   09/04/17 0940  TempSrc:   PainSc: 0-No pain      Patients Stated Pain Goal: 8 (09/04/17 0940)  Complications: No apparent anesthesia complications

## 2017-09-05 ENCOUNTER — Encounter (HOSPITAL_BASED_OUTPATIENT_CLINIC_OR_DEPARTMENT_OTHER): Payer: Self-pay | Admitting: Urology

## 2017-09-08 DIAGNOSIS — N200 Calculus of kidney: Secondary | ICD-10-CM | POA: Diagnosis not present

## 2017-09-08 DIAGNOSIS — N35011 Post-traumatic bulbous urethral stricture: Secondary | ICD-10-CM | POA: Diagnosis not present

## 2017-11-09 DIAGNOSIS — N200 Calculus of kidney: Secondary | ICD-10-CM | POA: Diagnosis not present

## 2017-12-11 ENCOUNTER — Ambulatory Visit (INDEPENDENT_AMBULATORY_CARE_PROVIDER_SITE_OTHER): Payer: BLUE CROSS/BLUE SHIELD | Admitting: Podiatry

## 2017-12-11 ENCOUNTER — Ambulatory Visit (INDEPENDENT_AMBULATORY_CARE_PROVIDER_SITE_OTHER): Payer: BLUE CROSS/BLUE SHIELD

## 2017-12-11 ENCOUNTER — Encounter: Payer: Self-pay | Admitting: Podiatry

## 2017-12-11 DIAGNOSIS — M2011 Hallux valgus (acquired), right foot: Secondary | ICD-10-CM

## 2017-12-11 DIAGNOSIS — M1000 Idiopathic gout, unspecified site: Secondary | ICD-10-CM | POA: Diagnosis not present

## 2017-12-11 DIAGNOSIS — M2010 Hallux valgus (acquired), unspecified foot: Secondary | ICD-10-CM

## 2017-12-11 DIAGNOSIS — M779 Enthesopathy, unspecified: Secondary | ICD-10-CM

## 2017-12-11 DIAGNOSIS — M2012 Hallux valgus (acquired), left foot: Secondary | ICD-10-CM

## 2017-12-11 MED ORDER — TRIAMCINOLONE ACETONIDE 10 MG/ML IJ SUSP
10.0000 mg | Freq: Once | INTRAMUSCULAR | Status: AC
  Administered 2017-12-11: 10 mg

## 2017-12-11 MED ORDER — MELOXICAM 15 MG PO TABS: 15.0000 mg | ORAL_TABLET | Freq: Every day | ORAL | 2 refills | Status: AC

## 2017-12-11 NOTE — Patient Instructions (Signed)

## 2017-12-12 NOTE — Progress Notes (Signed)
Subjective:   Patient ID: Roxy CedarAnthony Searight, male   DOB: 62 y.o.   MRN: [id number]   HPI Patient presents stating that he goes to the gym every day and he started to develop pain in the bottom of his right foot about 3 weeks ago and he does not remember specific injury.  Started when he was on the treadmill   ROS      Objective:  Physical Exam  Neurovascular status intact with inflammation fluid buildup around the first MPJ plantar right around the sesamoidal complex with patient having cavus foot structure     Assessment:  Probability for inflammatory plantar capsulitis sesamoiditis versus smaller possibility of fracture of the sesamoid     Plan:  H&P reviewed condition and at this point were just can a focus on reducing the inflammation and I did a plantar injection of the capsule 3 mg dexamethasone Kenalog 5 mg Xylocaine and applied thick pad around the area to reduce pressure.  Patient also has a boot he is been wearing and I encouraged him to wear that saw him and we will reevaluate and I did discuss a new orthotic with a deeper seat in this particular area to reduce pressure  X-rays were inconclusive but did not indicate an aggressive fracture of the sesamoidal bone

## 2017-12-28 ENCOUNTER — Ambulatory Visit (INDEPENDENT_AMBULATORY_CARE_PROVIDER_SITE_OTHER): Payer: BLUE CROSS/BLUE SHIELD | Admitting: Podiatry

## 2017-12-28 ENCOUNTER — Encounter: Payer: Self-pay | Admitting: Podiatry

## 2017-12-28 DIAGNOSIS — M7752 Other enthesopathy of left foot: Secondary | ICD-10-CM

## 2017-12-28 DIAGNOSIS — M779 Enthesopathy, unspecified: Secondary | ICD-10-CM

## 2017-12-28 DIAGNOSIS — M7751 Other enthesopathy of right foot: Secondary | ICD-10-CM | POA: Diagnosis not present

## 2017-12-28 DIAGNOSIS — M2010 Hallux valgus (acquired), unspecified foot: Secondary | ICD-10-CM

## 2017-12-28 NOTE — Progress Notes (Signed)
Subjective:   Patient ID: David Williamson, male   DOB: 62 y.o.   MRN: [id number]   HPI Patient presents stating a lot of pain underneath the right foot that has resolved.  Knows that he walks differently would like some kind of new inserts to take pressure off the bone surfaces   ROS      Objective:  Physical Exam  Neurovascular status intact with sesamoiditis-like symptoms right which have reduced quite nicely with patient having cavus foot structure relating to excessive plantar pressure     Assessment:  Cavus foot structure with chronic irritation around the sesamoidal complex right over left with inflammatory capsulitis     Plan:  H&P condition reviewed and recommended a softer type orthotic to reduce the stress against the joint surface.  Spent a great deal time educating him on his foot structure and different treatment options and at this point patient is scanned for customized orthotics by the ped orthotist and will have them delivered to him.  Patient will be seen back for dispensing of devices

## 2017-12-29 DIAGNOSIS — R229 Localized swelling, mass and lump, unspecified: Secondary | ICD-10-CM | POA: Diagnosis not present

## 2017-12-29 DIAGNOSIS — D1779 Benign lipomatous neoplasm of other sites: Secondary | ICD-10-CM | POA: Diagnosis not present

## 2017-12-29 DIAGNOSIS — Z6825 Body mass index (BMI) 25.0-25.9, adult: Secondary | ICD-10-CM | POA: Diagnosis not present

## 2018-01-22 ENCOUNTER — Ambulatory Visit: Payer: BLUE CROSS/BLUE SHIELD | Admitting: Orthotics

## 2018-01-22 DIAGNOSIS — M1000 Idiopathic gout, unspecified site: Secondary | ICD-10-CM

## 2018-01-22 DIAGNOSIS — M2010 Hallux valgus (acquired), unspecified foot: Secondary | ICD-10-CM

## 2018-01-22 DIAGNOSIS — M779 Enthesopathy, unspecified: Secondary | ICD-10-CM

## 2018-01-22 NOTE — Progress Notes (Signed)
Patient came in today to pick up custom made foot orthotics.  The goals were accomplished and the patient reported no dissatisfaction with said orthotics.  Patient was advised of breakin period and how to report any issues. 

## 2018-03-19 ENCOUNTER — Telehealth: Payer: Self-pay | Admitting: Podiatry

## 2018-03-19 NOTE — Telephone Encounter (Signed)
Pt called and left a message about the cost of him getting some orthotics refurbished and the length of time it would possibly take.  I returned call and left him a message that it cost 90.00 to refurbish and it is due when dropping them off and that it usually takes about 2 weeks but maybe a little longer this time due to Raiford Noble being out of the office and having to catch up.

## 2018-03-22 DIAGNOSIS — M79676 Pain in unspecified toe(s): Secondary | ICD-10-CM

## 2018-10-25 IMAGING — CT CT ABD-PELV W/O CM
2 of 4 series · 12 of 46 positions shown, 14 images · non-contrast
Comparison: Abdominal ultrasound dated June 02, 2015.

CLINICAL DATA: Asymptomatic microscopic hematuria.

EXAM:
CT ABDOMEN AND PELVIS WITHOUT CONTRAST
TECHNIQUE: Multidetector CT imaging of the abdomen and pelvis was performed
following the standard protocol without IV contrast.

[Series 2: renal stone 5.00 br40 s3 ax · axial · 0.51mm/px · z∈[+1135,+1505]mm · 9 of 90 slices shown, 11 images]
[im 8/90  soft-tissue]
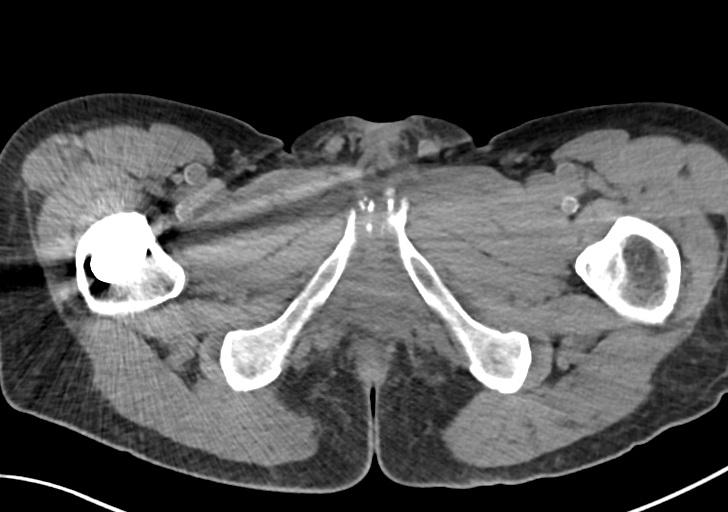
[im 8/90  bone]
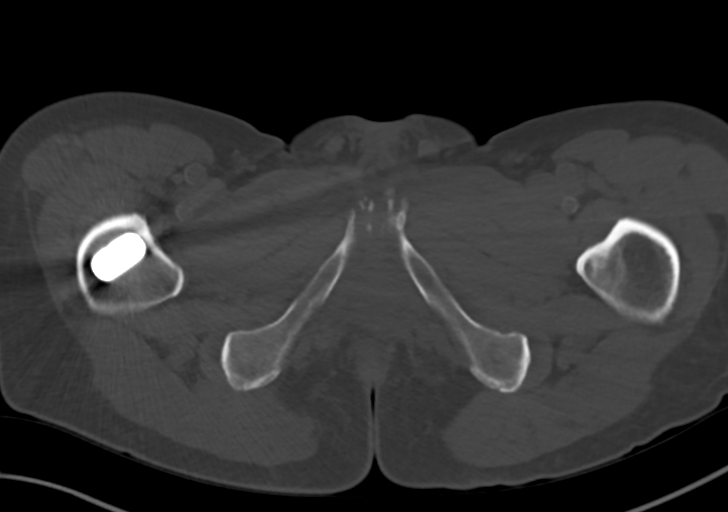
[im 15/90  soft-tissue]
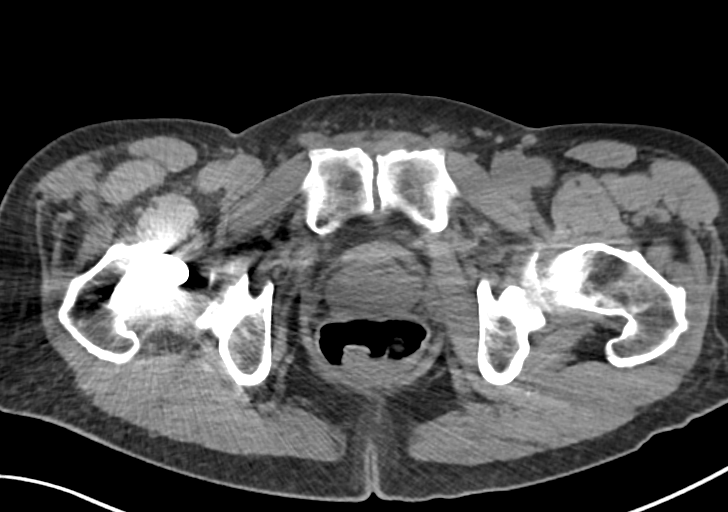
[im 26/90  soft-tissue]
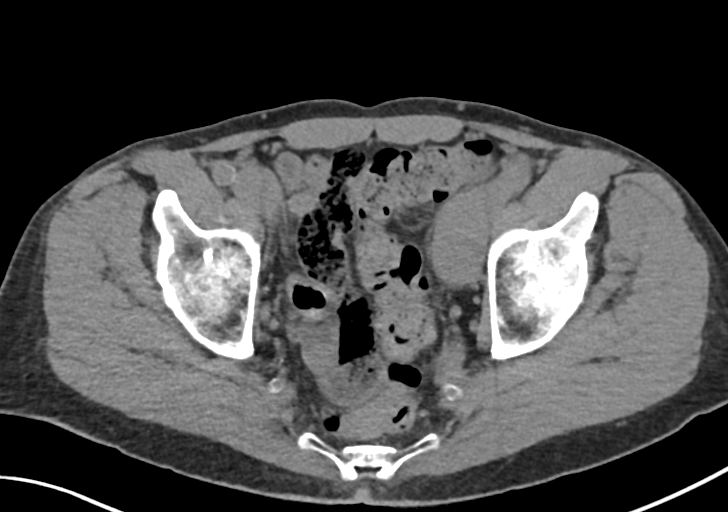
[im 34/90  soft-tissue]
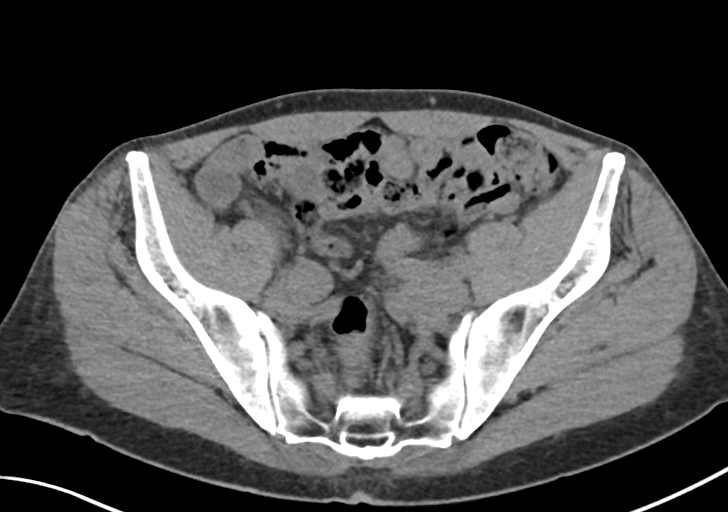
[im 45/90  soft-tissue]
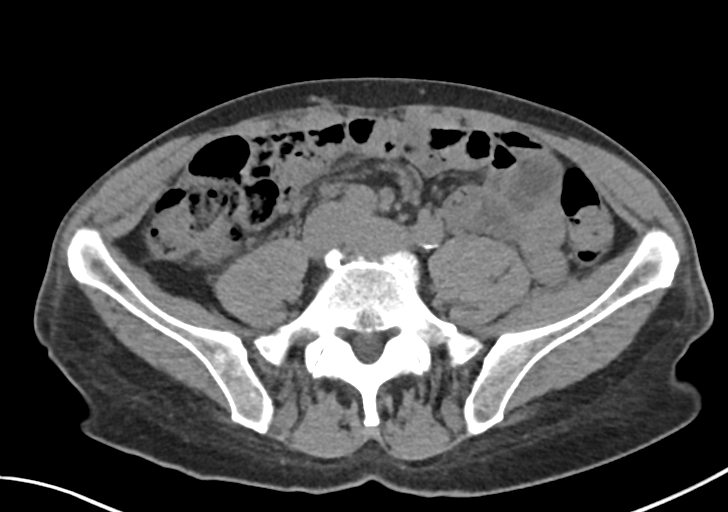
[im 56/90  soft-tissue]
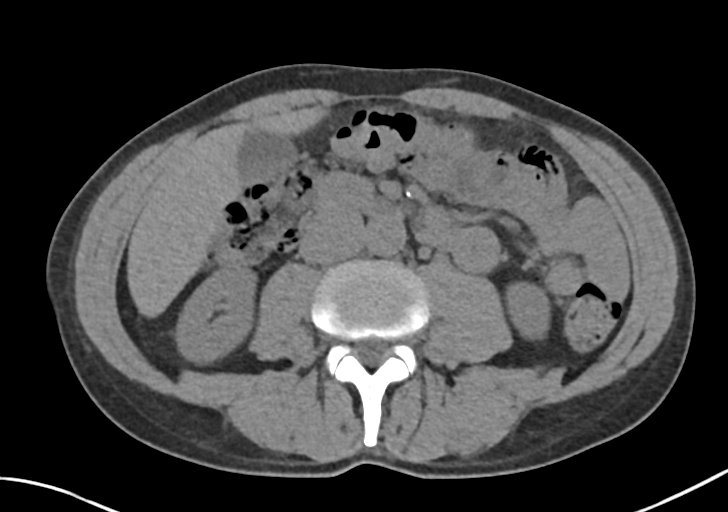
[im 64/90  soft-tissue]
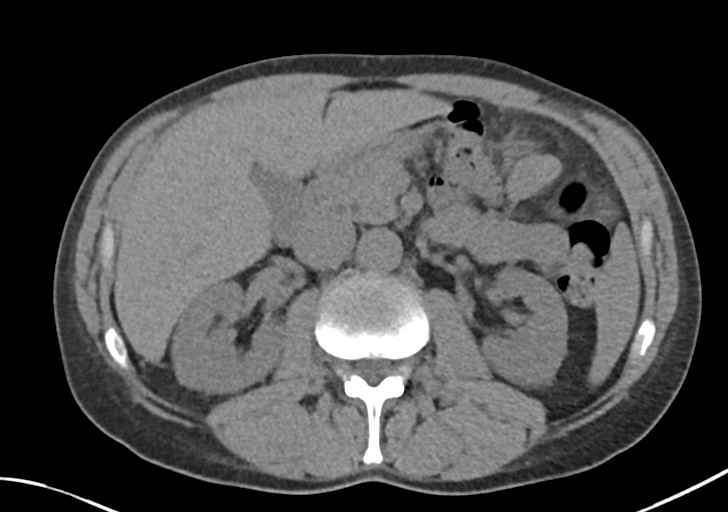
[im 75/90  soft-tissue]
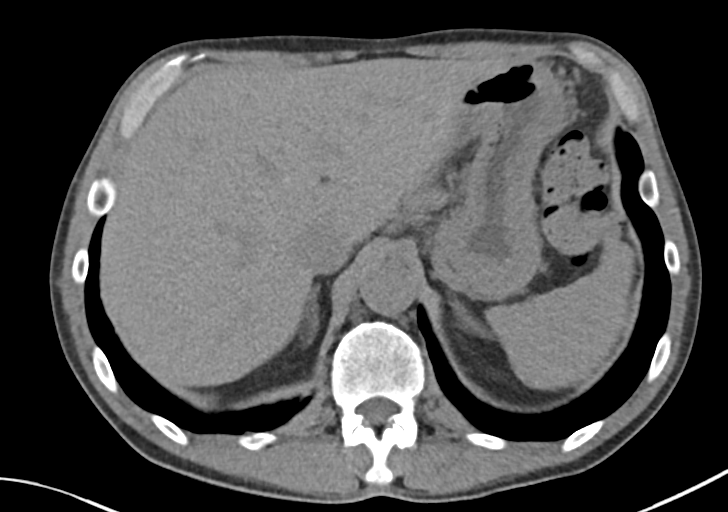
[im 82/90  soft-tissue]
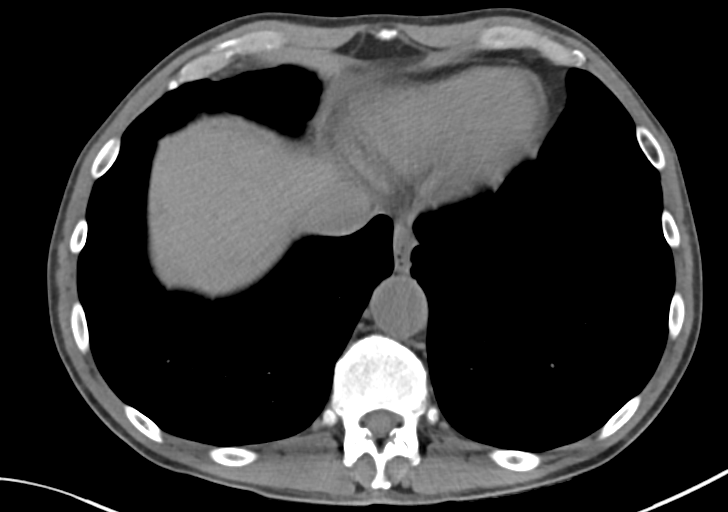
[im 82/90  bone]
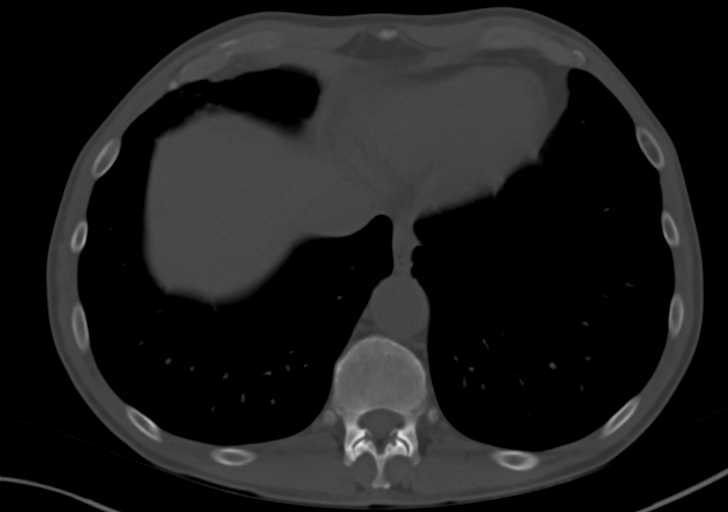

[Series 6: renal stone 2.00 br40 s3 cor · coronal · 0.72mm/px · 3 of 129 slices shown]
[im 43/129  soft-tissue]
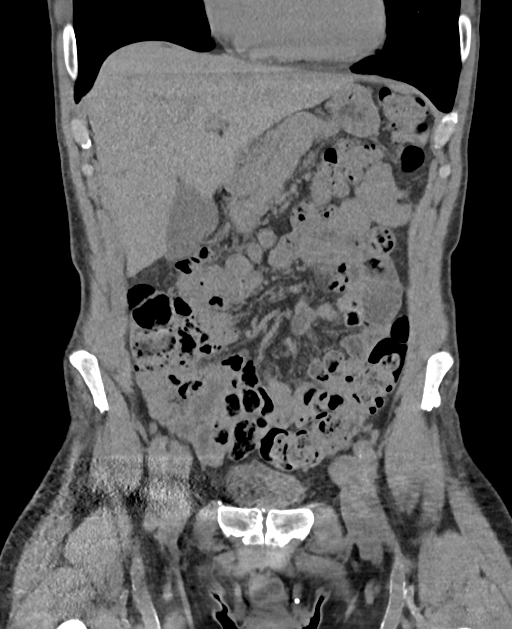
[im 57/129  soft-tissue]
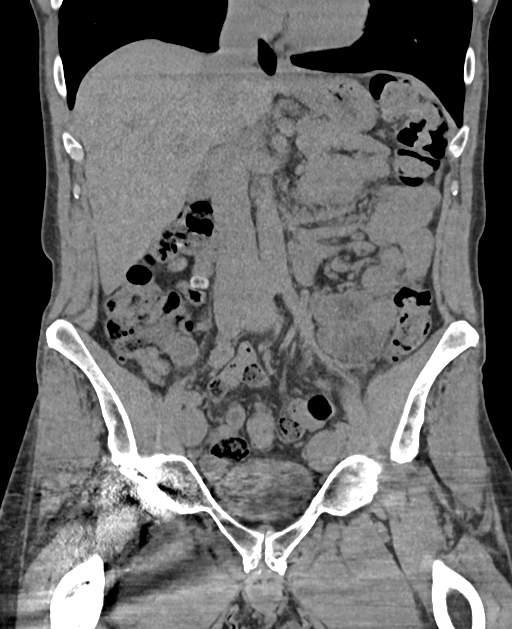
[im 72/129  soft-tissue]
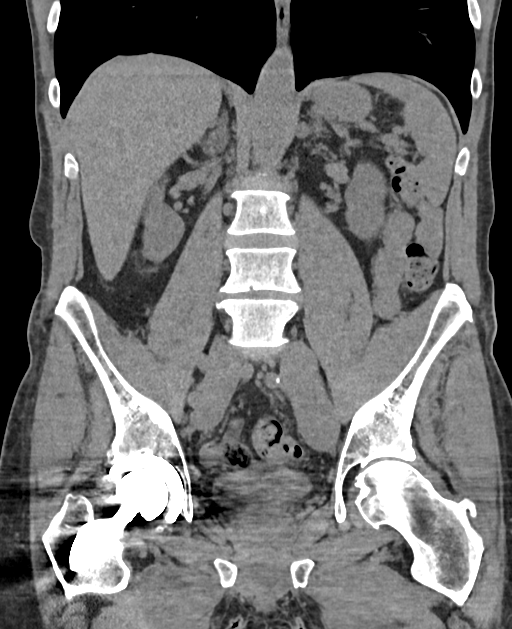

[12 of 46 positions shown; findings below may reference images not displayed]

FINDINGS: Lower chest: No acute abnormality.

Hepatobiliary: No focal liver abnormality is seen. No gallstones,
gallbladder wall thickening, or biliary dilatation.

Pancreas: Unremarkable. No pancreatic ductal dilatation or
surrounding inflammatory changes.

Spleen: Normal in size without focal abnormality.

Adrenals/Urinary Tract: The adrenal glands and right kidney are
unremarkable. Two calculi in the lower pole of the left kidney
measuring up to 7 mm. No ureteral calculi. No hydronephrosis. The
bladder is unremarkable.

Stomach/Bowel: Stomach is within normal limits. Appendix appears
normal. No evidence of bowel wall thickening, distention, or
inflammatory changes.

Vascular/Lymphatic: Minimal aortic atherosclerosis. No enlarged
abdominal or pelvic lymph nodes.

Reproductive: Prostate is unremarkable.

Other: No abdominal wall hernia or abnormality. No abdominopelvic
ascites. No pneumoperitoneum.

Musculoskeletal: No acute or significant osseous findings. Prior
right total hip arthroplasty.
IMPRESSION: 1. Nonobstructive left nephrolithiasis.

## 2019-11-18 ENCOUNTER — Other Ambulatory Visit: Payer: Self-pay | Admitting: Urology

## 2019-11-18 DIAGNOSIS — N200 Calculus of kidney: Secondary | ICD-10-CM | POA: Insufficient documentation

## 2019-11-19 ENCOUNTER — Encounter: Payer: Self-pay | Admitting: Urology

## 2019-11-19 NOTE — Addendum Note (Signed)
Addended by: Ferdinand Lango on: 11/19/2019 09:56 AM   Modules accepted: Orders

## 2019-12-24 ENCOUNTER — Other Ambulatory Visit: Payer: Self-pay

## 2019-12-24 ENCOUNTER — Ambulatory Visit (HOSPITAL_COMMUNITY)
Admission: RE | Admit: 2019-12-24 | Discharge: 2019-12-24 | Disposition: A | Discharge status: Home / Self Care | Payer: BLUE CROSS/BLUE SHIELD | Source: Ambulatory Visit | Attending: Urology | Admitting: Urology

## 2019-12-24 ENCOUNTER — Ambulatory Visit (INDEPENDENT_AMBULATORY_CARE_PROVIDER_SITE_OTHER): Payer: BLUE CROSS/BLUE SHIELD | Admitting: Urology

## 2019-12-24 ENCOUNTER — Encounter: Payer: Self-pay | Admitting: Urology

## 2019-12-24 VITALS — BP 149/74 | HR 57 | Temp 98.6°F | Ht 72.0 in | Wt 178.0 lb

## 2019-12-24 DIAGNOSIS — N200 Calculus of kidney: Secondary | ICD-10-CM | POA: Diagnosis not present

## 2019-12-24 DIAGNOSIS — N35011 Post-traumatic bulbous urethral stricture: Secondary | ICD-10-CM | POA: Diagnosis not present

## 2019-12-24 LAB — URINALYSIS, ROUTINE W REFLEX MICROSCOPIC
Bilirubin, UA: NEGATIVE
Glucose, UA: NEGATIVE
Ketones, UA: NEGATIVE
Leukocytes,UA: NEGATIVE
Nitrite, UA: NEGATIVE
Protein,UA: NEGATIVE
RBC, UA: NEGATIVE
Specific Gravity, UA: 1.015 (ref 1.005–1.030)
Urobilinogen, Ur: 0.2 mg/dL (ref 0.2–1.0)
pH, UA: 5 (ref 5.0–7.5)

## 2019-12-24 NOTE — Patient Instructions (Signed)
Dietary Guidelines to Help Prevent Kidney Stones Kidney stones are deposits of minerals and salts that form inside your kidneys. Your risk of developing kidney stones may be greater depending on your diet, your lifestyle, the medicines you take, and whether you have certain medical conditions. Most people can reduce their chances of developing kidney stones by following the instructions below. Depending on your overall health and the type of kidney stones you tend to develop, your dietitian may give you more specific instructions. What are tips for following this plan? Reading food labels  Choose foods with "no salt added" or "low-salt" labels. Limit your sodium intake to less than 1500 mg per day.  Choose foods with calcium for each meal and snack. Try to eat about 300 mg of calcium at each meal. Foods that contain 200-500 mg of calcium per serving include: ? 8 oz (237 ml) of milk, fortified nondairy milk, and fortified fruit juice. ? 8 oz (237 ml) of kefir, yogurt, and soy yogurt. ? 4 oz (118 ml) of tofu. ? 1 oz of cheese. ? 1 cup (300 g) of dried figs. ? 1 cup (91 g) of cooked broccoli. ? 1-3 oz can of sardines or mackerel.  Most people need 1000 to 1500 mg of calcium each day. Talk to your dietitian about how much calcium is recommended for you. Shopping  Buy plenty of fresh fruits and vegetables. Most people do not need to avoid fruits and vegetables, even if they contain nutrients that may contribute to kidney stones.  When shopping for convenience foods, choose: ? Whole pieces of fruit. ? Premade salads with dressing on the side. ? Low-fat fruit and yogurt smoothies.  Avoid buying frozen meals or prepared deli foods.  Look for foods with live cultures, such as yogurt and kefir. Cooking  Do not add salt to food when cooking. Place a salt shaker on the table and allow each person to add his or her own salt to taste.  Use vegetable protein, such as beans, textured vegetable  protein (TVP), or tofu instead of meat in pasta, casseroles, and soups. Meal planning   Eat less salt, if told by your dietitian. To do this: ? Avoid eating processed or premade food. ? Avoid eating fast food.  Eat less animal protein, including cheese, meat, poultry, or fish, if told by your dietitian. To do this: ? Limit the number of times you have meat, poultry, fish, or cheese each week. Eat a diet free of meat at least 2 days a week. ? Eat only one serving each day of meat, poultry, fish, or seafood. ? When you prepare animal protein, cut pieces into small portion sizes. For most meat and fish, one serving is about the size of one deck of cards.  Eat at least 5 servings of fresh fruits and vegetables each day. To do this: ? Keep fruits and vegetables on hand for snacks. ? Eat 1 piece of fruit or a handful of berries with breakfast. ? Have a salad and fruit at lunch. ? Have two kinds of vegetables at dinner.  Limit foods that are high in a substance called oxalate. These include: ? Spinach. ? Rhubarb. ? Beets. ? Potato chips and french fries. ? Nuts.  If you regularly take a diuretic medicine, make sure to eat at least 1-2 fruits or vegetables high in potassium each day. These include: ? Avocado. ? Banana. ? Orange, prune, carrot, or tomato juice. ? Baked potato. ? Cabbage. ? Beans and split   peas. General instructions   Drink enough fluid to keep your urine clear or pale yellow. This is the most important thing you can do.  Talk to your health care provider and dietitian about taking daily supplements. Depending on your health and the cause of your kidney stones, you may be advised: ? Not to take supplements with vitamin C. ? To take a calcium supplement. ? To take a daily probiotic supplement. ? To take other supplements such as magnesium, fish oil, or vitamin B6.  Take all medicines and supplements as told by your health care provider.  Limit alcohol intake to no  more than 1 drink a day for nonpregnant women and 2 drinks a day for men. One drink equals 12 oz of beer, 5 oz of wine, or 1 oz of hard liquor.  Lose weight if told by your health care provider. Work with your dietitian to find strategies and an eating plan that works best for you. What foods are not recommended? Limit your intake of the following foods, or as told by your dietitian. Talk to your dietitian about specific foods you should avoid based on the type of kidney stones and your overall health. Grains Breads. Bagels. Rolls. Baked goods. Salted crackers. Cereal. Pasta. Vegetables Spinach. Rhubarb. Beets. Canned vegetables. Pickles. Olives. Meats and other protein foods Nuts. Nut butters. Large portions of meat, poultry, or fish. Salted or cured meats. Deli meats. Hot dogs. Sausages. Dairy Cheese. Beverages Regular soft drinks. Regular vegetable juice. Seasonings and other foods Seasoning blends with salt. Salad dressings. Canned soups. Soy sauce. Ketchup. Barbecue sauce. Canned pasta sauce. Casseroles. Pizza. Lasagna. Frozen meals. Potato chips. French fries. Summary  You can reduce your risk of kidney stones by making changes to your diet.  The most important thing you can do is drink enough fluid. You should drink enough fluid to keep your urine clear or pale yellow.  Ask your health care provider or dietitian how much protein from animal sources you should eat each day, and also how much salt and calcium you should have each day. This information is not intended to replace advice given to you by your health care provider. Make sure you discuss any questions you have with your health care provider. Document Revised: 04/18/2018 Document Reviewed: 12/08/2015 Elsevier Patient Education  2020 [employer] Inc[employer]  

## 2019-12-24 NOTE — Progress Notes (Signed)
Urological Symptom Review  Patient is experiencing the following symptoms: Get up at night to urinate   Review of Systems  Gastrointestinal (upper)  : Negative for upper GI symptoms  Gastrointestinal (lower) : Negative for lower GI symptoms  Constitutional : Negative for symptoms  Skin: Negative for skin symptoms  Eyes: Negative for eye symptoms  Ear/Nose/Throat : Negative for Ear/Nose/Throat symptoms  Hematologic/Lymphatic: Negative for Hematologic/Lymphatic symptoms  Cardiovascular : Negative for cardiovascular symptoms  Respiratory : Negative for respiratory symptoms  Endocrine: Negative for endocrine symptoms  Musculoskeletal: Back pain Joint pain  Neurological: Negative for neurological symptoms  Psychologic: Negative for psychiatric symptoms 

## 2019-12-24 NOTE — Progress Notes (Signed)
12/24/2019 4:17 PM   David Williamson [birth year] [id number]  Referring provider: Creola Corn, MD [address] [place],  [place] [address]  followup urethral stricture and nephrolithiasis  HPI: Mr David Williamson is a 64yo her for followup for nephrolithiasis and urethral stricture. No stone events since last visit. No flank pain. Renal US from today shows no calculi and no hydronephrosis. Urine stream strong. Nocturia 1-2x per night. He is happy with his urination. His PSA has increased from 1.8 to 2.8 in the past year. No worsening LUTS   PMH: Past Medical History:  Diagnosis Date   Abnormal EKG 02/22/2017   Anal fissure    Anxiety    Arthritis    knees, ankles, hip , back    GERD (gastroesophageal reflux disease)    Gout    Heart palpitations    Rare   Hematuria    History of kidney stones    Premature atrial contractions    Premature ventricular contraction    Sinus bradycardia    asymptomatic   Vertigo    one episode   Wears glasses     Surgical History: Past Surgical History:  Procedure Laterality Date   COLONOSCOPY     2003,2007   CYSTOSCOPY WITH RETROGRADE PYELOGRAM, URETEROSCOPY AND STENT PLACEMENT Left 09/04/2017   Procedure: CYSTOSCOPY WITH RETROGRADE PYELOGRAM, URETEROSCOPY, LASER LITHOTRIPSY  AND STENT PLACEMENT;  Surgeon: David Gauze, MD;  Location: [facility];  Service: Urology;  Laterality: Left;   CYSTOSCOPY WITH URETHRAL DILATATION N/A 09/04/2017   Procedure: CYSTOSCOPY WITH URETHRAL DILATATION;  Surgeon: David Gauze, MD;  Location: [facility];  Service: Urology;  Laterality: N/A;   gout     kidney stone removal     right hip replacement  11/14   SPHINCTEROTOMY      Home Medications:  Allergies as of 12/24/2019   No Known Allergies     Medication List       Accurate as of December 24, 2019  4:17 PM. If you have any questions, ask your nurse or doctor.        allopurinol 300 MG  tablet Commonly known as: ZYLOPRIM Take 300 mg by mouth daily.   Cholecalciferol 125 MCG (5000 UT) Tabs Take by mouth.   cyanocobalamin 1000 MCG tablet Take by mouth.   GLUCOSAMINE SULFATE PO Take 2,000 mg by mouth 2 (two) times daily.   meloxicam 15 MG tablet Commonly known as: MOBIC Take 1 tablet (15 mg total) by mouth daily.   OVER THE COUNTER MEDICATION 1,200 mg 2 (two) times daily. Tart Cherry Extract   tamsulosin 0.4 MG Caps capsule Commonly known as: FLOMAX Take 1 capsule (0.4 mg total) by mouth daily after supper.   TURMERIC CURCUMIN PO Take 500 mg by mouth 2 (two) times daily.       Allergies: No Known Allergies  Family History: Family History  Problem Relation Age of Onset   Kidney cancer Father    Diabetes Mellitus II Mother    Coronary artery disease Mother        died post op day one from CABG   Kidney cancer Brother    Hypertension Other    Colon cancer Neg Hx    Colon polyps Neg Hx    Esophageal cancer Neg Hx    Rectal cancer Neg Hx    Stomach cancer Neg Hx     Social History:  reports that he has never smoked. He has never used smokeless tobacco.  He reports current alcohol use of about 14.0 standard drinks of alcohol per week. He reports that he does not use drugs.  ROS: All other review of systems were reviewed and are negative except what is noted above in HPI  Physical Exam: BP (!) 149/74    Pulse (!) 57    Temp 98.6 F (37 C)    Ht 6' (1.829 m)    Wt 178 lb (80.7 kg)    BMI 24.14 kg/m   Constitutional:  Alert and oriented, No acute distress. HEENT: [place] AT, moist mucus membranes.  Trachea midline, no masses. Cardiovascular: No clubbing, cyanosis, or edema. Respiratory: Normal respiratory effort, no increased work of breathing. GI: Abdomen is soft, nontender, nondistended, no abdominal masses GU: No CVA tenderness.  Lymph: No cervical or inguinal lymphadenopathy. Skin: No rashes, bruises or suspicious lesions. Neurologic:  Grossly intact, no focal deficits, moving all 4 extremities. Psychiatric: Normal mood and affect.  Laboratory Data: Lab Results  Component Value Date   HGB 15.0 02/13/2010   HCT 44.0 02/13/2010    Lab Results  Component Value Date   CREATININE 0.95 02/22/2017    No results found for: PSA  No results found for: TESTOSTERONE  No results found for: HGBA1C  Urinalysis No results found for: COLORURINE, APPEARANCEUR, LABSPEC, PHURINE, GLUCOSEU, HGBUR, BILIRUBINUR, KETONESUR, PROTEINUR, UROBILINOGEN, NITRITE, LEUKOCYTESUR  No results found for: LABMICR, WBCUA, RBCUA, LABEPIT, MUCUS, BACTERIA  Pertinent Imaging: Renal US today: Images reviewed and discussed with the patient No results found for this or any previous visit.  No results found for this or any previous visit.  No results found for this or any previous visit.  No results found for this or any previous visit.  No results found for this or any previous visit.  No results found for this or any previous visit.  No results found for this or any previous visit.  No results found for this or any previous visit.   Assessment & Plan:    1. Nephrolithiasis -RTC 1 year with renal US - Urinalysis, Routine w reflex microscopic  2. Post-traumatic bulbous urethral stricture -no LUTS at this time.   3. Increasing PSA -PSa in 6 months and patient will send his PSA results   No follow-ups on file.  David Aye, MD  [facility]

## 2020-04-27 ENCOUNTER — Encounter: Payer: Self-pay | Admitting: Urology

## 2020-12-18 ENCOUNTER — Ambulatory Visit (HOSPITAL_COMMUNITY): Payer: BLUE CROSS/BLUE SHIELD

## 2020-12-23 ENCOUNTER — Ambulatory Visit: Payer: BLUE CROSS/BLUE SHIELD | Admitting: Urology

## 2020-12-23 ENCOUNTER — Ambulatory Visit (HOSPITAL_COMMUNITY): Payer: Medicare Other

## 2021-02-12 ENCOUNTER — Other Ambulatory Visit: Payer: Self-pay

## 2021-02-12 DIAGNOSIS — N200 Calculus of kidney: Secondary | ICD-10-CM

## 2021-02-12 DIAGNOSIS — N35011 Post-traumatic bulbous urethral stricture: Secondary | ICD-10-CM

## 2021-03-22 ENCOUNTER — Ambulatory Visit (HOSPITAL_COMMUNITY): Payer: Medicare Other

## 2021-03-24 ENCOUNTER — Ambulatory Visit: Payer: Medicare Other | Admitting: Urology

## 2021-03-24 ENCOUNTER — Encounter: Payer: Self-pay | Admitting: Urology

## 2021-03-24 ENCOUNTER — Ambulatory Visit (HOSPITAL_COMMUNITY)
Admission: RE | Admit: 2021-03-24 | Discharge: 2021-03-24 | Disposition: A | Discharge status: Home / Self Care | Payer: Medicare Other | Source: Ambulatory Visit | Attending: Urology | Admitting: Urology

## 2021-03-24 ENCOUNTER — Other Ambulatory Visit: Payer: Self-pay

## 2021-03-24 VITALS — BP 124/55 | HR 50

## 2021-03-24 DIAGNOSIS — N35011 Post-traumatic bulbous urethral stricture: Secondary | ICD-10-CM | POA: Diagnosis not present

## 2021-03-24 DIAGNOSIS — N200 Calculus of kidney: Secondary | ICD-10-CM

## 2021-03-24 LAB — URINALYSIS, ROUTINE W REFLEX MICROSCOPIC
Bilirubin, UA: NEGATIVE
Glucose, UA: NEGATIVE
Ketones, UA: NEGATIVE
Leukocytes,UA: NEGATIVE
Nitrite, UA: NEGATIVE
Protein,UA: NEGATIVE
RBC, UA: NEGATIVE
Specific Gravity, UA: <1.005 — ABNORMAL LOW (ref 1.005–1.030)
Urobilinogen, Ur: 0.2 mg/dL (ref 0.2–1.0)
pH, UA: 5.5 (ref 5.0–7.5)

## 2021-03-24 NOTE — Progress Notes (Signed)
? ?03/24/2021 ?2:30 PM  ? ?David Williamson ?07-29-1955 ?[id number] ? ?Referring provider: Ward Givens., MD ?[address] [place] ?[place],  [place] [address] ? ?Followup nephrolithiasis and urethral stricture ? ? ?HPI: ?Mr David Williamson is a 66yo here for followuop for a urethral stricture and nephrolithiasis. He denies nay significant LUTS. Urine stream is strong. No straining to urinate. IPSS 5 QOL 1. He denies any stone events since last visit. No flank pain. Renal US from today shows no calculi. No other complaitns today ? ? ?PMH: ?Past Medical History:  ?Diagnosis Date  ? Abnormal EKG 02/22/2017  ? Anal fissure   ? Anxiety   ? Arthritis   ? knees, ankles, hip , back   ? GERD (gastroesophageal reflux disease)   ? Gout   ? Heart palpitations   ? Rare  ? Hematuria   ? History of kidney stones   ? Premature atrial contractions   ? Premature ventricular contraction   ? Sinus bradycardia   ? asymptomatic  ? Vertigo   ? one episode  ? Wears glasses   ? ? ?Surgical History: ?Past Surgical History:  ?Procedure Laterality Date  ? COLONOSCOPY    ? [id number]  ? CYSTOSCOPY WITH RETROGRADE PYELOGRAM, URETEROSCOPY AND STENT PLACEMENT Left 09/04/2017  ? Procedure: CYSTOSCOPY WITH RETROGRADE PYELOGRAM, URETEROSCOPY, LASER LITHOTRIPSY  AND STENT PLACEMENT;  Surgeon: David Gauze, MD;  Location[facility];  Service: Urology;  Laterality: Left;  ? CYSTOSCOPY WITH URETHRAL DILATATION N/A 09/04/2017  ? Procedure: CYSTOSCOPY WITH URETHRAL DILATATION;  Surgeon: David Gauze, MD;  Location: [facility];  Service: Urology;  Laterality: N/A;  ? gout    ? kidney stone removal    ? right hip replacement  11/14  ? SPHINCTEROTOMY    ? ? ?Home Medications:  ?Allergies as of 03/24/2021   ?No Known Allergies ?  ? ?  ?Medication List  ?  ? ?  ? Accurate as of March 24, 2021  2:30 PM. If you have any questions, ask your nurse or doctor.  ?  ?  ? ?  ? ?allopurinol 300 MG tablet ?Commonly known  as: ZYLOPRIM ?Take 300 mg by mouth daily. ?  ?Cholecalciferol 125 MCG (5000 UT) Tabs ?Take by mouth. ?  ?cyanocobalamin 1000 MCG tablet ?Take by mouth. ?  ?GLUCOSAMINE SULFATE PO ?Take 2,000 mg by mouth 2 (two) times daily. ?  ?meloxicam 15 MG tablet ?Commonly known as: MOBIC ?Take 1 tablet (15 mg total) by mouth daily. ?  ?OVER THE COUNTER MEDICATION ?1,200 mg 2 (two) times daily. Tart Cherry Extract ?  ?TURMERIC CURCUMIN PO ?Take 500 mg by mouth 2 (two) times daily. ?  ? ?  ? ? ?Allergies: No Known Allergies ? ?Family History: ?Family History  ?Problem Relation Age of Onset  ? Kidney cancer Father   ? Diabetes Mellitus II Mother   ? Coronary artery disease Mother   ?     died post op day one from CABG  ? Kidney cancer Brother   ? Hypertension Other   ? Colon cancer Neg Hx   ? Colon polyps Neg Hx   ? Esophageal cancer Neg Hx   ? Rectal cancer Neg Hx   ? Stomach cancer Neg Hx   ? ? ?Social History:  reports that he has never smoked. He has never used smokeless tobacco. He reports current alcohol use of about 14.0 standard drinks per week. He reports that he does not use  drugs. ? ?ROS: ?All other review of systems were reviewed and are negative except what is noted above in HPI ? ?Physical Exam: ?BP (!) 124/55   Pulse (!) 50   ?Constitutional:  Alert and oriented, No acute distress. ?HEENT: [place] AT, moist mucus membranes.  Trachea midline, no masses. ?Cardiovascular: No clubbing, cyanosis, or edema. ?Respiratory: Normal respiratory effort, no increased work of breathing. ?GI: Abdomen is soft, nontender, nondistended, no abdominal masses ?GU: No CVA tenderness.  ?Lymph: No cervical or inguinal lymphadenopathy. ?Skin: No rashes, bruises or suspicious lesions. ?Neurologic: Grossly intact, no focal deficits, moving all 4 extremities. ?Psychiatric: Normal mood and affect. ? ?Laboratory Data: ?Lab Results  ?Component Value Date  ? HGB 15.0 02/13/2010  ? HCT 44.0 02/13/2010  ? ? ?Lab Results  ?Component Value Date  ?  CREATININE 0.95 02/22/2017  ? ? ?No results found for: PSA ? ?No results found for: TESTOSTERONE ? ?No results found for: HGBA1C ? ?Urinalysis ?   ?Component Value Date/Time  ? APPEARANCEUR Clear 12/24/2019 1546  ? GLUCOSEU Negative 12/24/2019 1546  ? BILIRUBINUR Negative 12/24/2019 1546  ? PROTEINUR Negative 12/24/2019 1546  ? NITRITE Negative 12/24/2019 1546  ? LEUKOCYTESUR Negative 12/24/2019 1546  ? ? ?Lab Results  ?Component Value Date  ? LABMICR Comment 12/24/2019  ? ? ?Pertinent Imaging: ?Renal US today: Images reviewed and discussed with the patient ?No results found for this or any previous visit. ? ?No results found for this or any previous visit. ? ?No results found for this or any previous visit. ? ?No results found for this or any previous visit. ? ?Results for orders placed during the hospital encounter of 12/24/19 ? ?Ultrasound renal complete ? ?Narrative ?CLINICAL DATA:  Nephrolithiasis ? ?EXAM: ?RENAL / URINARY TRACT ULTRASOUND COMPLETE ? ?COMPARISON:  CT 07/21/2017 ? ?FINDINGS: ?Right Kidney: ? ?Renal measurements: 10.8 x 5.3 x 5.5 cm = volume: 164.8 mL. ?Echogenicity within normal limits. No mass or hydronephrosis ?visualized. ? ?Left Kidney: ? ?Renal measurements: 10.6 x 6 x 4.7 cm = volume: 153.7 mL. ?Echogenicity within normal limits. No mass or hydronephrosis ?visualized. ? ?Bladder: ? ?Appears normal for degree of bladder distention. ? ?Other: ? ?None. ? ?IMPRESSION: ?Negative renal ultrasound ? ? ?Electronically Signed ?By: Jasmine Pang M.D. ?On: 12/24/2019 21:17 ? ?No results found for this or any previous visit. ? ?No results found for this or any previous visit. ? ?No results found for this or any previous visit. ? ? ?Assessment & Plan:   ? ?1. Nephrolithiasis ?-RTC 1 year with renal US ?- Urinalysis, Routine w reflex microscopic ? ?2. Post-traumatic bulbous urethral stricture ?-Patient denies any significant LUTS ? ?3.Elevated PSA ?-continue yearly PSA ? ? ?No follow-ups on  file. ? ?David Aye, MD ? ?[facility] [place] ?  ?

## 2021-03-24 NOTE — Patient Instructions (Signed)
Dietary Guidelines to Help Prevent Kidney Stones Kidney stones are deposits of minerals and salts that form inside your kidneys. Your risk of developing kidney stones may be greater depending on your diet, your lifestyle, the medicines you take, and whether you have certain medical conditions. Most people can lower their chances of developing kidney stones by following the instructions below. Your dietitian may give you more specific instructions depending on your overall health and the type of kidney stones you tend to develop. What are tips for following this plan? Reading food labels  Choose foods with "no salt added" or "low-salt" labels. Limit your salt (sodium) intake to less than 1,500 mg a day. Choose foods with calcium for each meal and snack. Try to eat about 300 mg of calcium at each meal. Foods that contain 200-500 mg of calcium a serving include: 8 oz (237 mL) of milk, calcium-fortifiednon-dairy milk, and calcium-fortifiedfruit juice. Calcium-fortified means that calcium has been added to these drinks. 8 oz (237 mL) of kefir, yogurt, and soy yogurt. 4 oz (114 g) of tofu. 1 oz (28 g) of cheese. 1 cup (150 g) of dried figs. 1 cup (91 g) of cooked broccoli. One 3 oz (85 g) can of sardines or mackerel. Most people need 1,000-1,500 mg of calcium a day. Talk to your dietitian about how much calcium is recommended for you. Shopping Buy plenty of fresh fruits and vegetables. Most people do not need to avoid fruits and vegetables, even if these foods contain nutrients that may contribute to kidney stones. When shopping for convenience foods, choose: Whole pieces of fruit. Pre-made salads with dressing on the side. Low-fat fruit and yogurt smoothies. Avoid buying frozen meals or prepared deli foods. These can be high in sodium. Look for foods with live cultures, such as yogurt and kefir. Choose high-fiber grains, such as whole-wheat breads, oat bran, and wheat cereals. Cooking Do not add  salt to food when cooking. Place a salt shaker on the table and allow each person to add his or her own salt to taste. Use vegetable protein, such as beans, textured vegetable protein (TVP), or tofu, instead of meat in pasta, casseroles, and soups. Meal planning Eat less salt, if told by your dietitian. To do this: Avoid eating processed or pre-made food. Avoid eating fast food. Eat less animal protein, including cheese, meat, poultry, or fish, if told by your dietitian. To do this: Limit the number of times you have meat, poultry, fish, or cheese each week. Eat a diet free of meat at least 2 days a week. Eat only one serving each day of meat, poultry, fish, or seafood. When you prepare animal protein, cut pieces into small portion sizes. For most meat and fish, one serving is about the size of the palm of your hand. Eat at least five servings of fresh fruits and vegetables each day. To do this: Keep fruits and vegetables on hand for snacks. Eat one piece of fruit or a handful of berries with breakfast. Have a salad and fruit at lunch. Have two kinds of vegetables at dinner. Limit foods that are high in a substance called oxalate. These include: Spinach (cooked), rhubarb, beets, sweet potatoes, and Swiss chard. Peanuts. Potato chips, french fries, and baked potatoes with skin on. Nuts and nut products. Chocolate. If you regularly take a diuretic medicine, make sure to eat at least 1 or 2 servings of fruits or vegetables that are high in potassium each day. These include: Avocado. Banana. Orange, prune,   carrot, or tomato juice. Baked potato. Cabbage. Beans and split peas. Lifestyle  Drink enough fluid to keep your urine pale yellow. This is the most important thing you can do. Spread your fluid intake throughout the day. If you drink alcohol: Limit how much you use to: 0-1 drink a day for women who are not pregnant. 0-2 drinks a day for men. Be aware of how much alcohol is in your  drink. In the [place] one drink equals one 12 oz bottle of beer (355 mL), one 5 oz glass of wine (148 mL), or one 1 oz glass of hard liquor (44 mL). Lose weight if told by your health care provider. Work with your dietitian to find an eating plan and weight loss strategies that work best for you. General information Talk to your health care provider and dietitian about taking daily supplements. You may be told the following depending on your health and the cause of your kidney stones: Not to take supplements with vitamin C. To take a calcium supplement. To take a daily probiotic supplement. To take other supplements such as magnesium, fish oil, or vitamin B6. Take over-the-counter and prescription medicines only as told by your health care provider. These include supplements. What foods should I limit? Limit your intake of the following foods, or eat them as told by your dietitian. Vegetables Spinach. Rhubarb. Beets. Canned vegetables. Pickles. Olives. Baked potatoes with skin. Grains Wheat bran. Baked goods. Salted crackers. Cereals high in sugar. Meats and other proteins Nuts. Nut butters. Large portions of meat, poultry, or fish. Salted, precooked, or cured meats, such as sausages, meat loaves, and hot dogs. Dairy Cheese. Beverages Regular soft drinks. Regular vegetable juice. Seasonings and condiments Seasoning blends with salt. Salad dressings. Soy sauce. Ketchup. Barbecue sauce. Other foods Canned soups. Canned pasta sauce. Casseroles. Pizza. Lasagna. Frozen meals. Potato chips. French fries. The items listed above may not be a complete list of foods and beverages you should limit. Contact a dietitian for more information. What foods should I avoid? Talk to your dietitian about specific foods you should avoid based on the type of kidney stones you have and your overall health. Fruits Grapefruit. The item listed above may not be a complete list of foods and beverages you should  avoid. Contact a dietitian for more information. Summary Kidney stones are deposits of minerals and salts that form inside your kidneys. You can lower your risk of kidney stones by making changes to your diet. The most important thing you can do is drink enough fluid. Drink enough fluid to keep your urine pale yellow. Talk to your dietitian about how much calcium you should have each day, and eat less salt and animal protein as told by your dietitian. This information is not intended to replace advice given to you by your health care provider. Make sure you discuss any questions you have with your health care provider. Document Revised: 12/20/2018 Document Reviewed: 12/20/2018 Elsevier Patient Education  2022 [employer]  

## 2021-03-29 IMAGING — US US RENAL
1 series · 14 of 25 positions shown · non-contrast
Comparison: CT 07/21/2017

CLINICAL DATA: Nephrolithiasis

EXAM:
RENAL / URINARY TRACT ULTRASOUND COMPLETE

[Series 1: us renal · 14 of 44 slices shown]
[im 1/44]
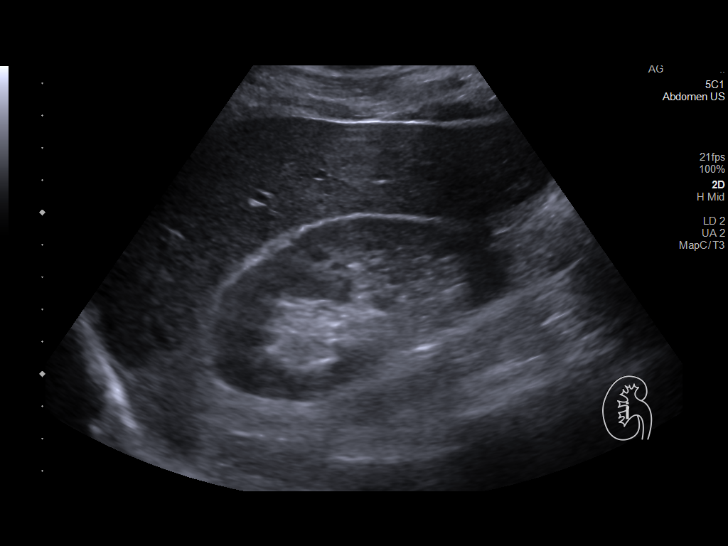
[im 4/44]
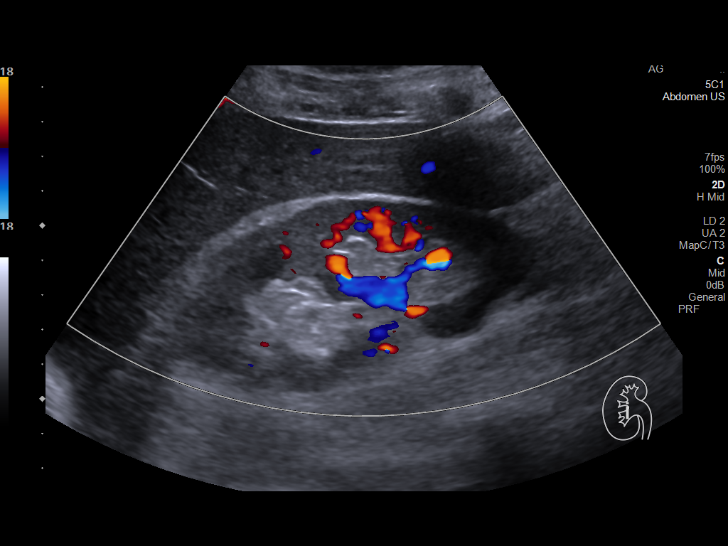
[im 8/44]
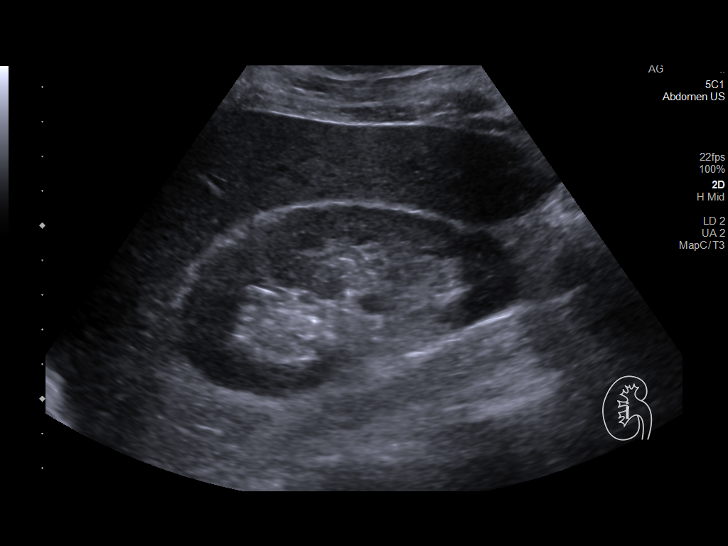
[im 11/44]
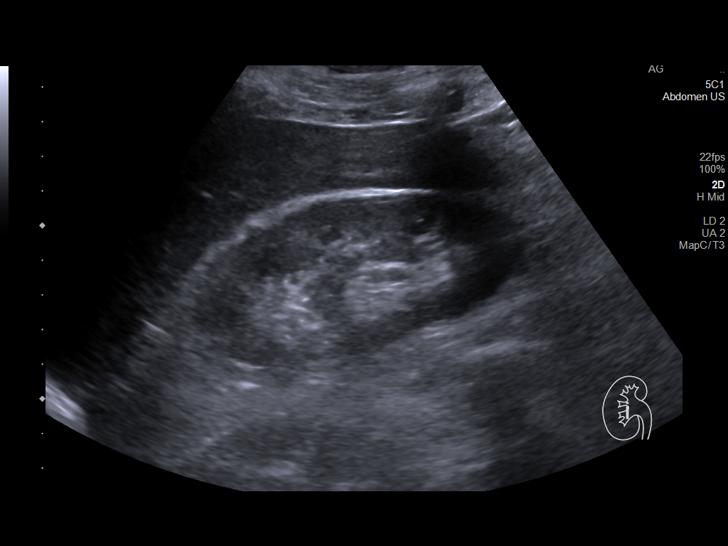
[im 15/44]
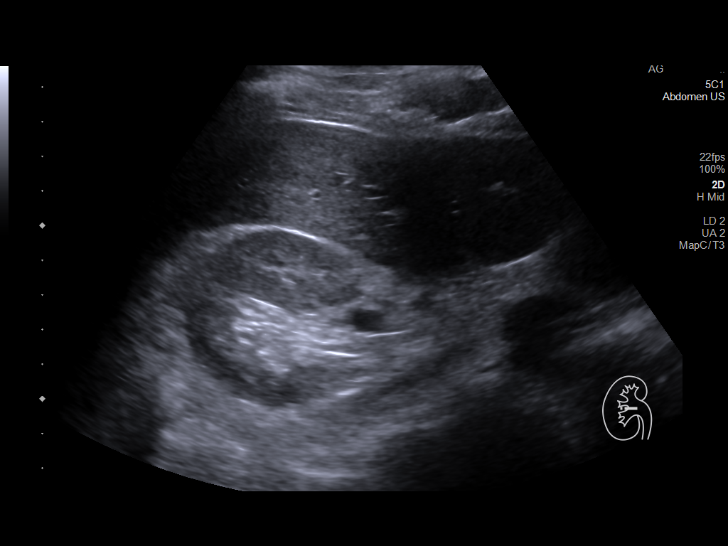
[im 17/44]
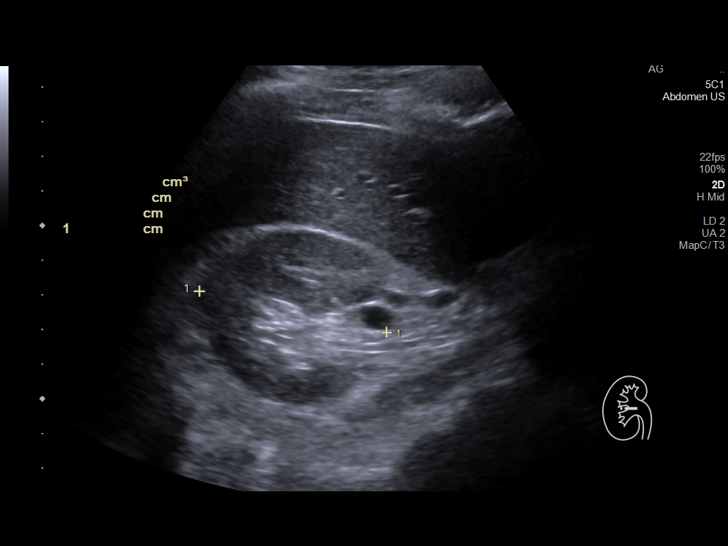
[im 20/44]
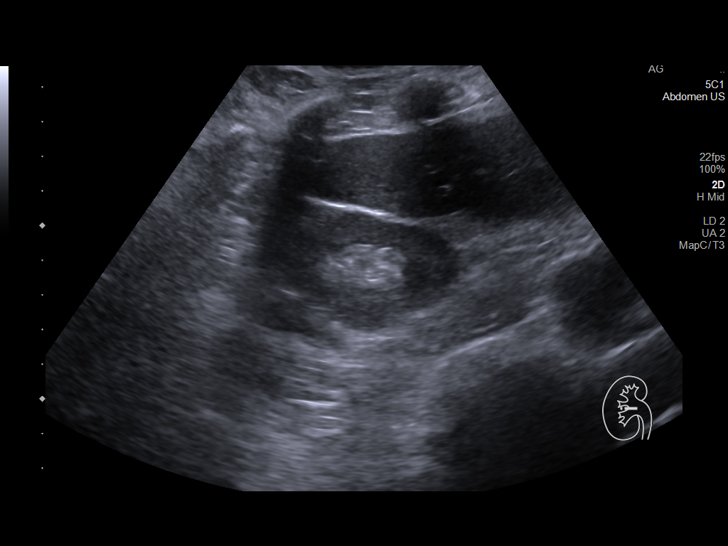
[im 24/44]
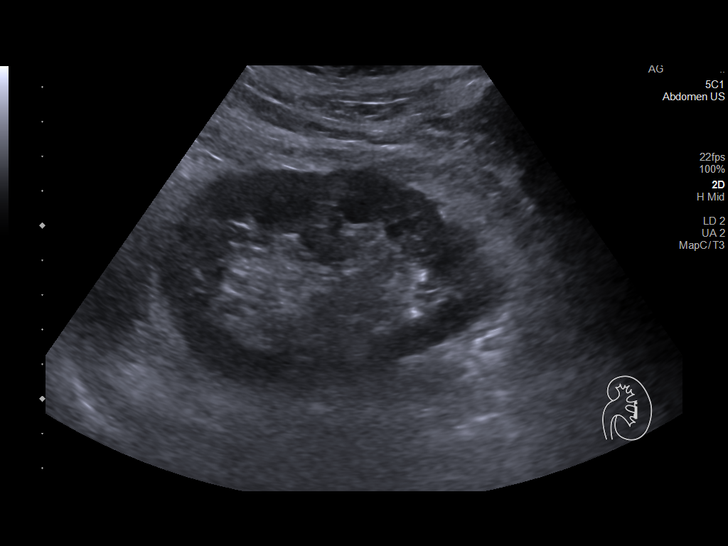
[im 27/44]
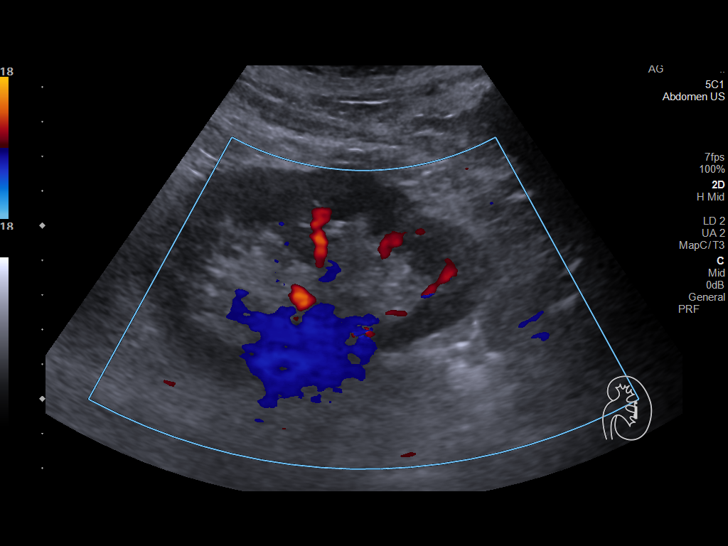
[im 29/44]
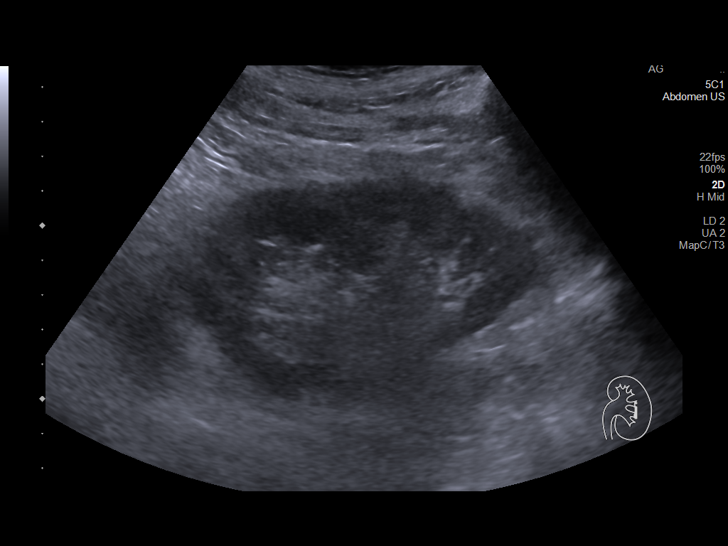
[im 33/44]
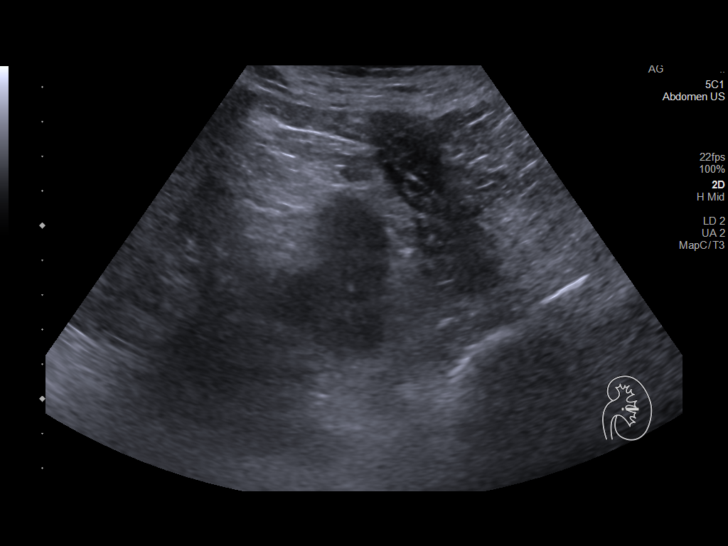
[im 36/44]
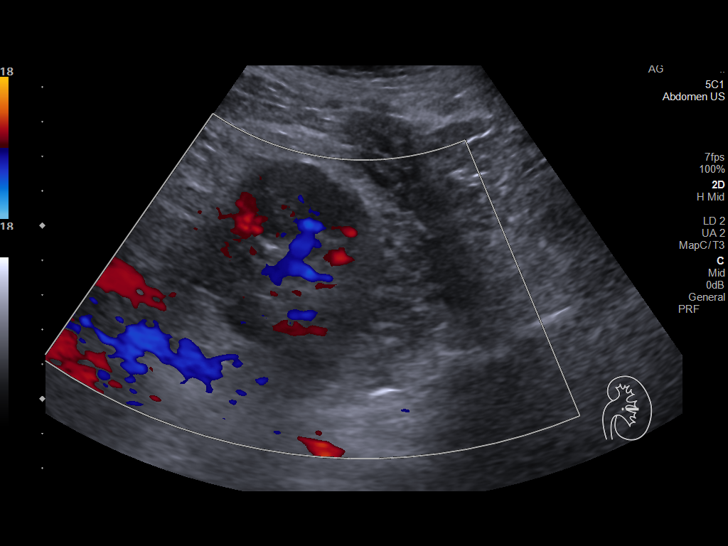
[im 40/44]
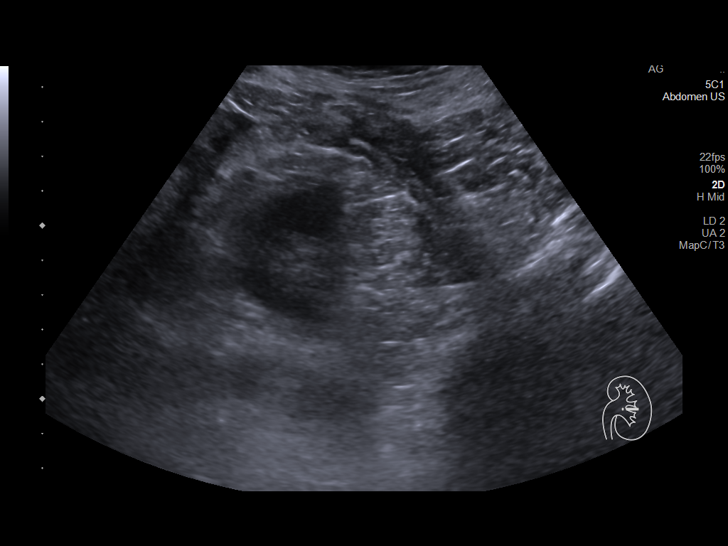
[im 44/44]
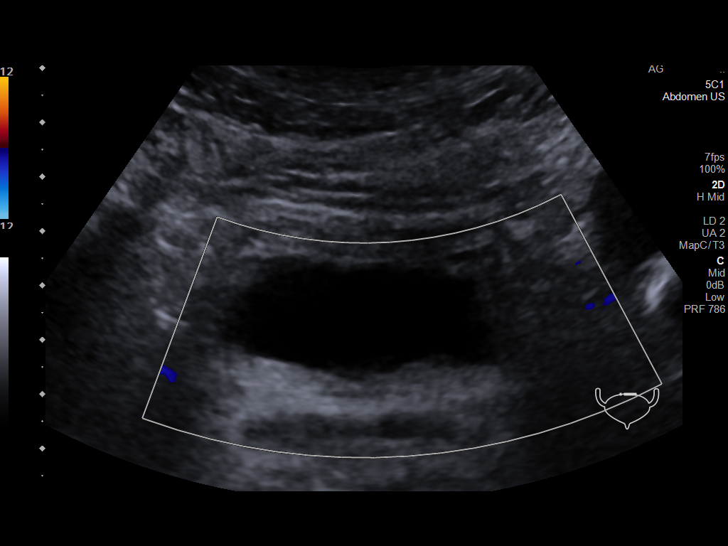

[14 of 25 positions shown; findings below may reference images not displayed]

FINDINGS: Right Kidney:

Renal measurements: 10.8 x 5.3 x 5.5 cm = volume: 164.8 mL.
Echogenicity within normal limits. No mass or hydronephrosis
visualized.

Left Kidney:

Renal measurements: 10.6 x 6 x 4.7 cm = volume: 153.7 mL.
Echogenicity within normal limits. No mass or hydronephrosis
visualized.

Bladder:

Appears normal for degree of bladder distention.

Other:

None.
IMPRESSION: Negative renal ultrasound

## 2021-04-06 ENCOUNTER — Other Ambulatory Visit: Payer: Self-pay

## 2021-04-06 DIAGNOSIS — N200 Calculus of kidney: Secondary | ICD-10-CM

## 2021-04-06 NOTE — Addendum Note (Signed)
Addended by: Ferdinand Lango on: 04/06/2021 02:57 PM ? ? Modules accepted: Orders ? ?

## 2022-03-25 ENCOUNTER — Ambulatory Visit: Payer: Medicare Other | Admitting: Urology

## 2022-03-30 ENCOUNTER — Ambulatory Visit (HOSPITAL_COMMUNITY)
Admission: RE | Admit: 2022-03-30 | Discharge: 2022-03-30 | Disposition: A | Discharge status: Home / Self Care | Payer: Medicare HMO | Source: Ambulatory Visit | Attending: Urology | Admitting: Urology

## 2022-03-30 ENCOUNTER — Ambulatory Visit: Payer: Medicare HMO | Admitting: Urology

## 2022-03-30 VITALS — BP 129/60 | HR 52

## 2022-03-30 DIAGNOSIS — N200 Calculus of kidney: Secondary | ICD-10-CM

## 2022-03-30 DIAGNOSIS — N35011 Post-traumatic bulbous urethral stricture: Secondary | ICD-10-CM | POA: Diagnosis not present

## 2022-03-30 NOTE — Progress Notes (Unsigned)
03/30/2022 2:25 PM   David Williamson 09/20/1955 [id number]  Referring provider: Eduardo Osier., MD [place],  [place] [address]  Followup nephrolithiasis   HPI: No stone events last visit. Renal US shows no calculi. IPSS 3 QOL 0.    PMH: Past Medical History:  Diagnosis Date   Abnormal EKG 02/22/2017   Anal fissure    Anxiety    Arthritis    knees, ankles, hip , back    GERD (gastroesophageal reflux disease)    Gout    Heart palpitations    Rare   Hematuria    History of kidney stones    Premature atrial contractions    Premature ventricular contraction    Sinus bradycardia    asymptomatic   Vertigo    one episode   Wears glasses     Surgical History: Past Surgical History:  Procedure Laterality Date   COLONOSCOPY     2003,2007   CYSTOSCOPY WITH RETROGRADE PYELOGRAM, URETEROSCOPY AND STENT PLACEMENT Left 09/04/2017   Procedure: CYSTOSCOPY WITH RETROGRADE PYELOGRAM, URETEROSCOPY, LASER LITHOTRIPSY  AND STENT PLACEMENT;  Surgeon: Cleon Gustin, MD;  Location: [facility];  Service: Urology;  Laterality: Left;   CYSTOSCOPY WITH URETHRAL DILATATION N/A 09/04/2017   Procedure: CYSTOSCOPY WITH URETHRAL DILATATION;  Surgeon: Cleon Gustin, MD;  Location: [facility];  Service: Urology;  Laterality: N/A;   gout     kidney stone removal     right hip replacement  11/14   SPHINCTEROTOMY      Home Medications:  Allergies as of 03/30/2022   No Known Allergies      Medication List        Accurate as of March 30, 2022  2:25 PM. If you have any questions, ask your nurse or doctor.          allopurinol 300 MG tablet Commonly known as: ZYLOPRIM Take 300 mg by mouth daily.   Cholecalciferol 125 MCG (5000 UT) Tabs Take by mouth.   cyanocobalamin 1000 MCG tablet Take by mouth.   GLUCOSAMINE SULFATE PO Take 2,000 mg by mouth 2 (two) times daily.   meloxicam 15 MG  tablet Commonly known as: MOBIC Take 1 tablet (15 mg total) by mouth daily.   OVER THE COUNTER MEDICATION 1,200 mg 2 (two) times daily. Tart Cherry Extract   TURMERIC CURCUMIN PO Take 500 mg by mouth 2 (two) times daily.        Allergies: No Known Allergies  Family History: Family History  Problem Relation Age of Onset   Kidney cancer Father    Diabetes Mellitus II Mother    Coronary artery disease Mother        died post op day one from CABG   Kidney cancer Brother    Hypertension Other    Colon cancer Neg Hx    Colon polyps Neg Hx    Esophageal cancer Neg Hx    Rectal cancer Neg Hx    Stomach cancer Neg Hx     Social History:  reports that he has never smoked. He has never used smokeless tobacco. He reports current alcohol use of about 14.0 standard drinks of alcohol per week. He reports that he does not use drugs.  ROS: All other review of systems were reviewed and are negative except what is noted above in HPI  Physical Exam: BP 129/60   Pulse (!) 52   Constitutional:  Alert and oriented, No acute  distress. HEENT: [place] AT, moist mucus membranes.  Trachea midline, no masses. Cardiovascular: No clubbing, cyanosis, or edema. Respiratory: Normal respiratory effort, no increased work of breathing. GI: Abdomen is soft, nontender, nondistended, no abdominal masses GU: No CVA tenderness.  Lymph: No cervical or inguinal lymphadenopathy. Skin: No rashes, bruises or suspicious lesions. Neurologic: Grossly intact, no focal deficits, moving all 4 extremities. Psychiatric: Normal mood and affect.  Laboratory Data: Lab Results  Component Value Date   HGB 15.0 02/13/2010   HCT 44.0 02/13/2010    Lab Results  Component Value Date   CREATININE 0.95 02/22/2017    No results found for: "PSA"  No results found for: "TESTOSTERONE"  No results found for: "HGBA1C"  Urinalysis    Component Value Date/Time   APPEARANCEUR Clear 03/24/2021 1411   GLUCOSEU Negative  03/24/2021 1411   BILIRUBINUR Negative 03/24/2021 1411   PROTEINUR Negative 03/24/2021 1411   NITRITE Negative 03/24/2021 1411   LEUKOCYTESUR Negative 03/24/2021 1411    Lab Results  Component Value Date   LABMICR Comment 03/24/2021    Pertinent Imaging: *** No results found for this or any previous visit.  No results found for this or any previous visit.  No results found for this or any previous visit.  No results found for this or any previous visit.  Results for orders placed during the hospital encounter of 03/24/21  Ultrasound renal complete  Narrative CLINICAL DATA:  Back pain.  Nephrolithiasis.  EXAM: RENAL / URINARY TRACT ULTRASOUND COMPLETE  COMPARISON:  December 24, 2019  FINDINGS: Right Kidney:  Renal measurements: 10.6 x 5.3 x 5.3 cm = volume: 153 mL. Echogenicity within normal limits. No mass or hydronephrosis visualized.  Left Kidney:  Renal measurements: 9.6 x 6.0 x 5.5 cm = volume: 163 mL. Echogenicity within normal limits. No mass or hydronephrosis visualized.  Bladder:  Appears normal for degree of bladder distention.  Other:  None.  IMPRESSION: Normal study. No cause for back pain. No nephrolithiasis identified.   Electronically Signed By: Dorise Bullion III M.D. On: 03/26/2021 17:11  No valid procedures specified. No results found for this or any previous visit.  No results found for this or any previous visit.   Assessment & Plan:    1. Nephrolithiasis ***  2. Post-traumatic bulbous urethral stricture ***   No follow-ups on file.  Nicolette Bang, MD  [facility] [place]

## 2022-03-31 ENCOUNTER — Encounter: Payer: Self-pay | Admitting: Urology

## 2022-03-31 NOTE — Patient Instructions (Signed)

## 2022-06-28 IMAGING — US US RENAL
1 series · 14 of 25 positions shown · non-contrast
Comparison: December 24, 2019

CLINICAL DATA: Back pain.  Nephrolithiasis.

EXAM:
RENAL / URINARY TRACT ULTRASOUND COMPLETE

[Series 1: us renal · 14 of 75 slices shown]
[im 1/75]
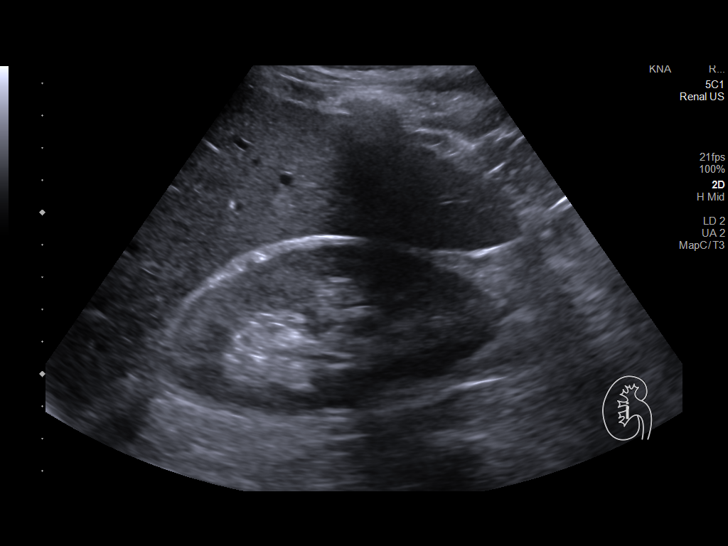
[im 7/75]
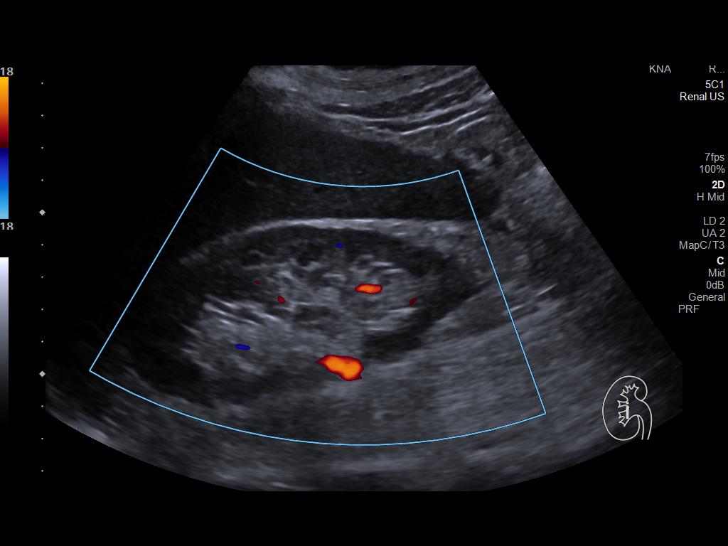
[im 13/75]
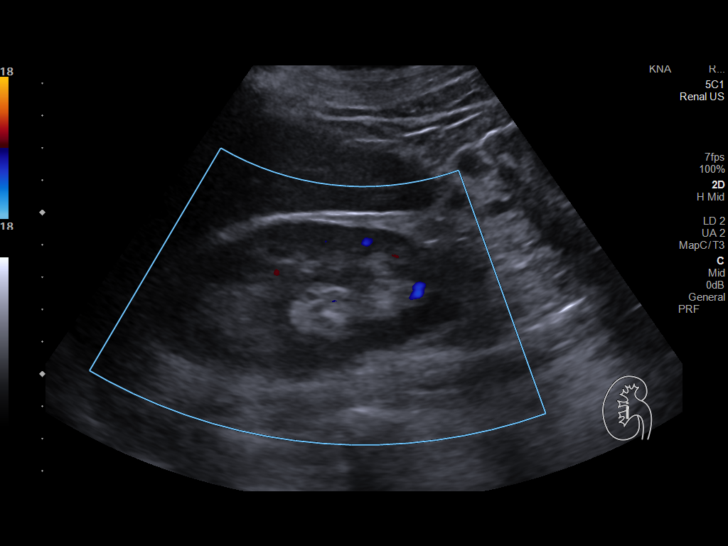
[im 19/75]
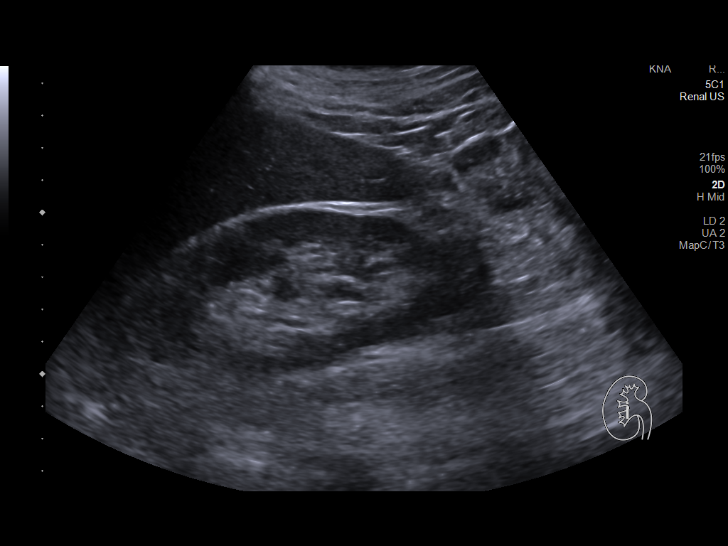
[im 25/75]
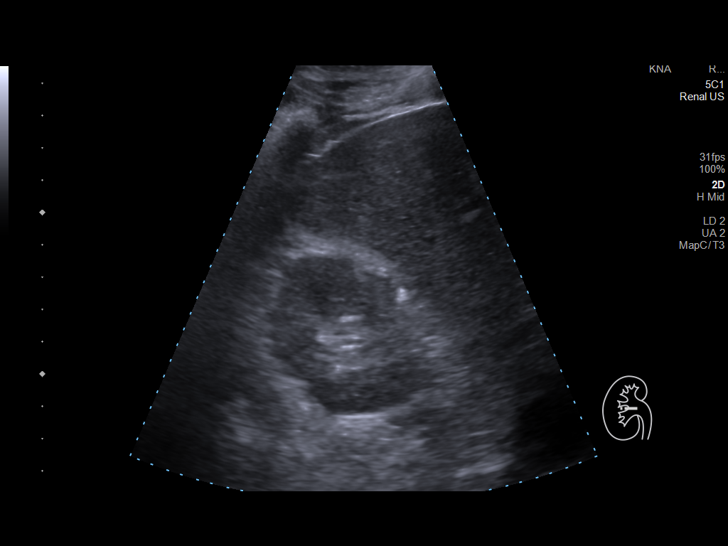
[im 28/75]
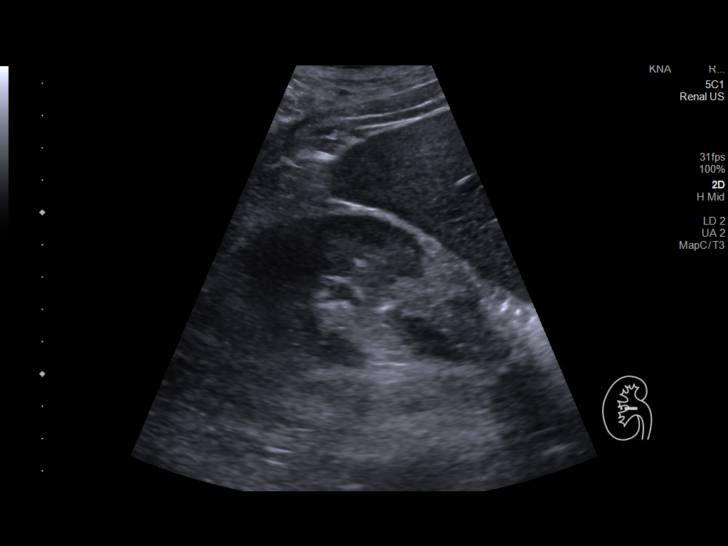
[im 34/75]
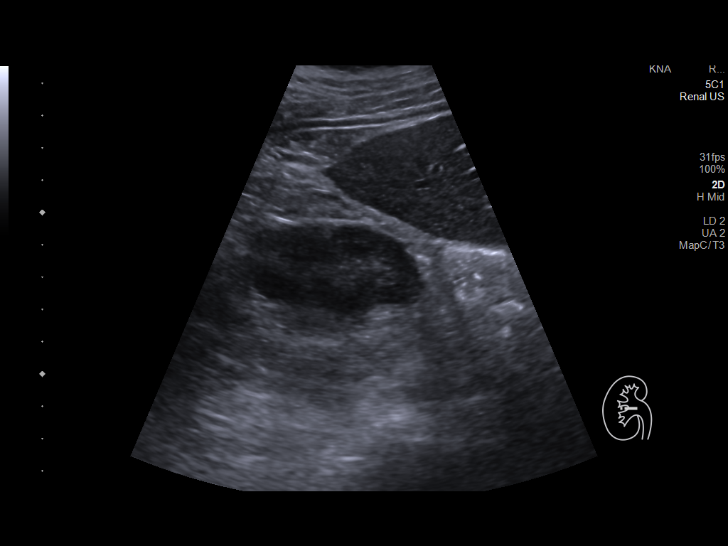
[im 41/75]
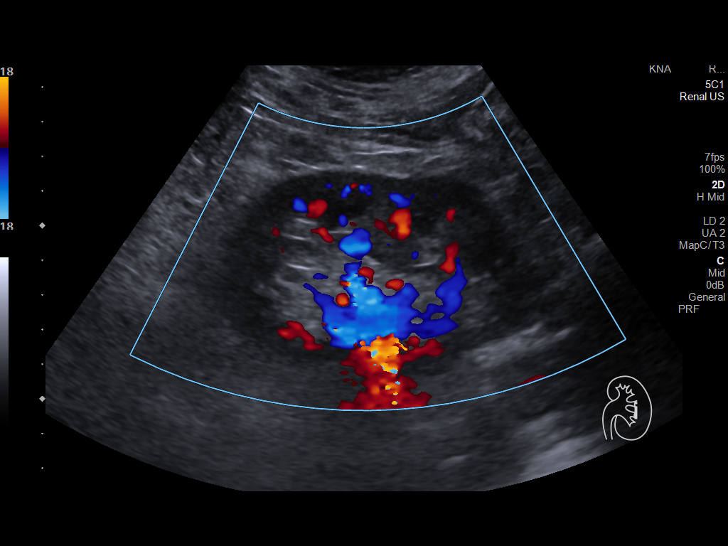
[im 47/75]
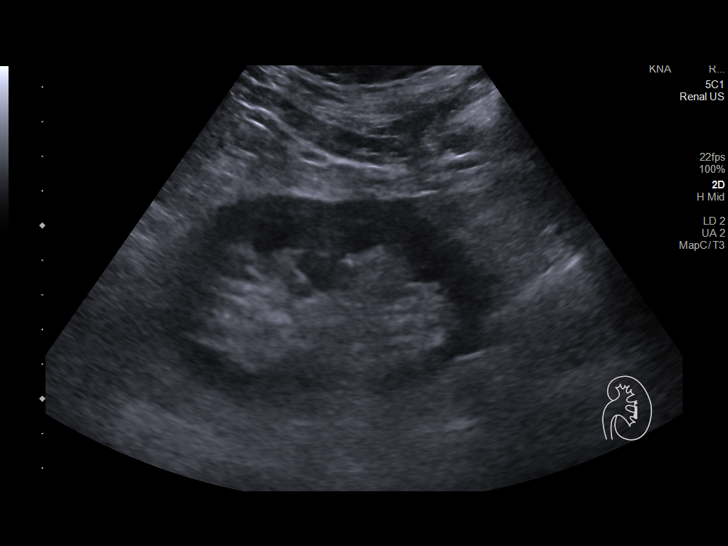
[im 50/75]
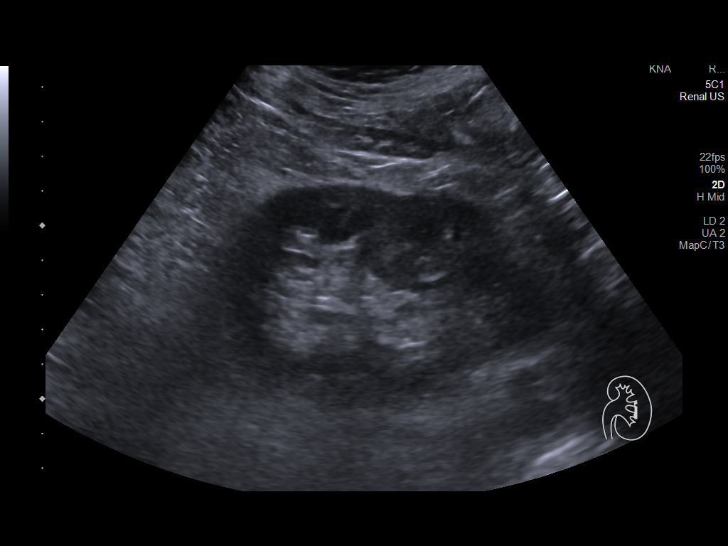
[im 56/75]
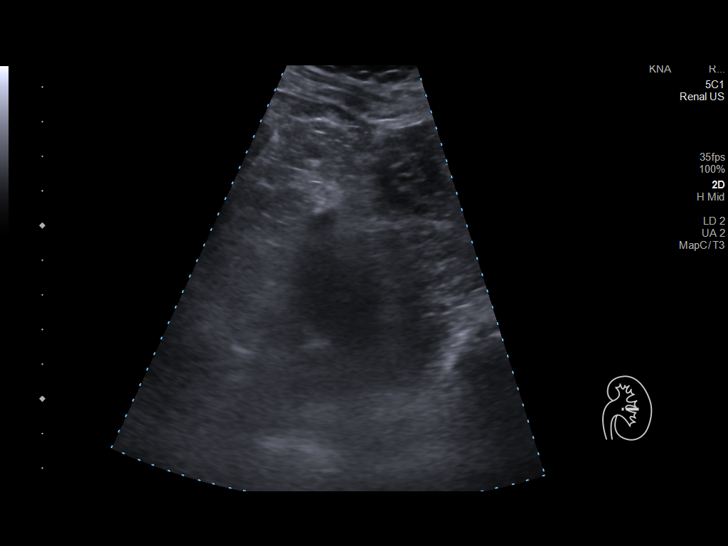
[im 62/75]
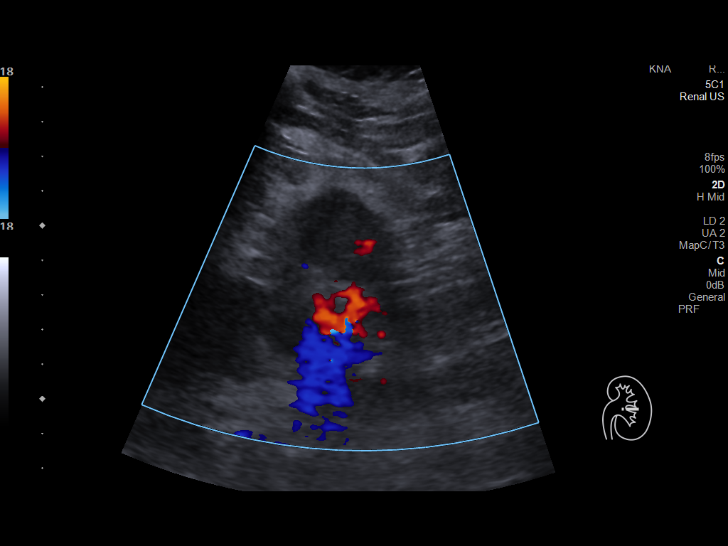
[im 68/75]
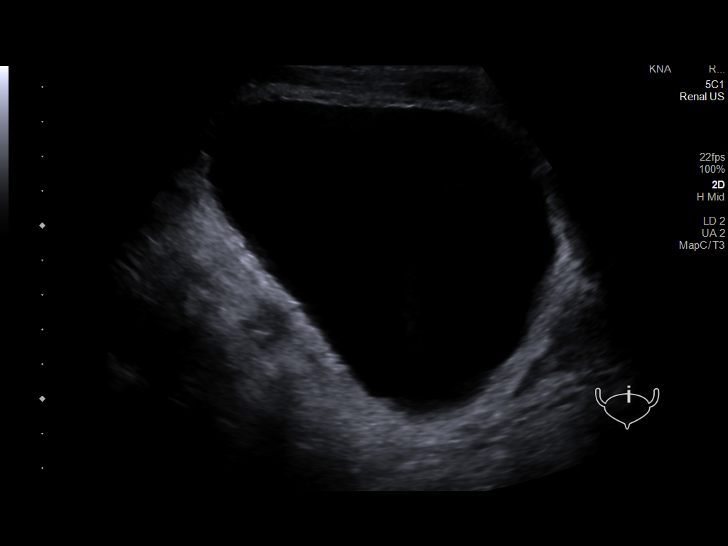
[im 75/75]
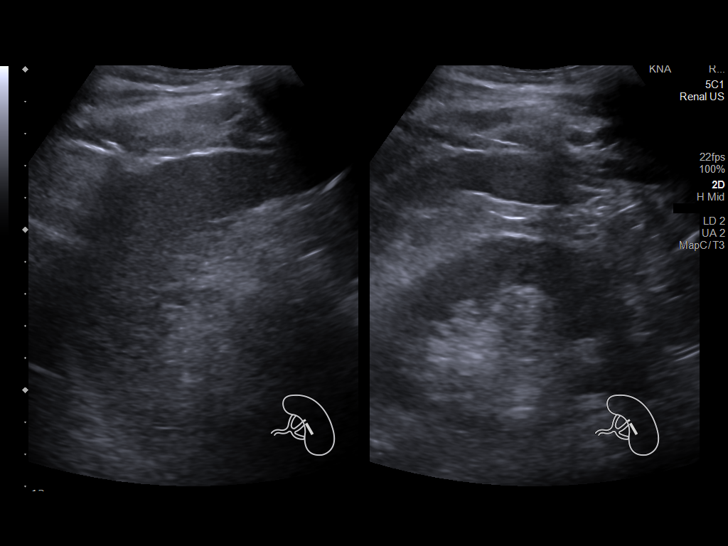

[14 of 25 positions shown; findings below may reference images not displayed]

FINDINGS: Right Kidney:

Renal measurements: 10.6 x 5.3 x 5.3 cm = volume: 153 mL.
Echogenicity within normal limits. No mass or hydronephrosis
visualized.

Left Kidney:

Renal measurements: 9.6 x 6.0 x 5.5 cm = volume: 163 mL.
Echogenicity within normal limits. No mass or hydronephrosis
visualized.

Bladder:

Appears normal for degree of bladder distention.

Other:

None.
IMPRESSION: Normal study. No cause for back pain. No nephrolithiasis identified.

## 2022-10-05 ENCOUNTER — Encounter: Payer: Self-pay | Admitting: Urology

## 2022-12-21 ENCOUNTER — Encounter: Payer: Self-pay | Admitting: Urology

## 2023-10-24 ENCOUNTER — Telehealth: Payer: Self-pay

## 2023-10-24 ENCOUNTER — Other Ambulatory Visit: Payer: Self-pay

## 2023-10-24 DIAGNOSIS — N200 Calculus of kidney: Secondary | ICD-10-CM

## 2023-10-24 NOTE — Progress Notes (Signed)
 Open in error

## 2023-10-24 NOTE — Telephone Encounter (Signed)
 Return pt called about insurance. Pt is made aware to contact his insurance to find out if we are in network. Pt given centralized scheduling phone to scheduled Renal U/S. Voiced understanding

## 2023-11-02 ENCOUNTER — Other Ambulatory Visit: Payer: Self-pay | Admitting: Urology

## 2023-11-02 ENCOUNTER — Encounter: Payer: Self-pay | Admitting: Urology

## 2023-11-02 DIAGNOSIS — N200 Calculus of kidney: Secondary | ICD-10-CM

## 2024-03-20 ENCOUNTER — Ambulatory Visit (HOSPITAL_COMMUNITY)

## 2024-03-20 ENCOUNTER — Ambulatory Visit: Admitting: Urology
